# Patient Record
Sex: Male | Born: 1955 | Race: Black or African American | Hispanic: No | Smoking: Current some day smoker
Health system: Southern US, Community
[De-identification: ages and names within clinical notes are randomized; demographics above are authoritative.]

## PROBLEM LIST (undated history)

## (undated) DIAGNOSIS — IMO0001 Reserved for inherently not codable concepts without codable children: Secondary | ICD-10-CM

## (undated) DIAGNOSIS — B1920 Unspecified viral hepatitis C without hepatic coma: Secondary | ICD-10-CM

## (undated) DIAGNOSIS — M1990 Unspecified osteoarthritis, unspecified site: Secondary | ICD-10-CM

## (undated) DIAGNOSIS — S2239XA Fracture of one rib, unspecified side, initial encounter for closed fracture: Secondary | ICD-10-CM

## (undated) DIAGNOSIS — S2249XA Multiple fractures of ribs, unspecified side, initial encounter for closed fracture: Secondary | ICD-10-CM

## (undated) DIAGNOSIS — S42009A Fracture of unspecified part of unspecified clavicle, initial encounter for closed fracture: Secondary | ICD-10-CM

## (undated) DIAGNOSIS — E7800 Pure hypercholesterolemia, unspecified: Secondary | ICD-10-CM

## (undated) DIAGNOSIS — G4730 Sleep apnea, unspecified: Secondary | ICD-10-CM

## (undated) DIAGNOSIS — J449 Chronic obstructive pulmonary disease, unspecified: Secondary | ICD-10-CM

## (undated) DIAGNOSIS — F419 Anxiety disorder, unspecified: Secondary | ICD-10-CM

## (undated) DIAGNOSIS — I10 Essential (primary) hypertension: Secondary | ICD-10-CM

## (undated) HISTORY — PX: COLONOSCOPY W/ POLYPECTOMY: SHX1380

## (undated) HISTORY — PX: INGUINAL HERNIA REPAIR: SUR1180

## (undated) HISTORY — PX: TONSILLECTOMY: SUR1361

## (undated) HISTORY — PX: JOINT REPLACEMENT: SHX530

## (undated) HISTORY — DX: Unspecified viral hepatitis C without hepatic coma: B19.20

---

## 2011-11-08 ENCOUNTER — Other Ambulatory Visit: Payer: Self-pay

## 2011-11-08 ENCOUNTER — Observation Stay (HOSPITAL_COMMUNITY)
Admission: EM | Admit: 2011-11-08 | Discharge: 2011-11-08 | Disposition: A | Discharge status: Home / Self Care | Payer: Self-pay | Attending: Emergency Medicine | Admitting: Emergency Medicine

## 2011-11-08 ENCOUNTER — Emergency Department (HOSPITAL_COMMUNITY): Payer: Self-pay

## 2011-11-08 ENCOUNTER — Encounter (HOSPITAL_COMMUNITY): Payer: Self-pay

## 2011-11-08 DIAGNOSIS — R0602 Shortness of breath: Secondary | ICD-10-CM | POA: Insufficient documentation

## 2011-11-08 DIAGNOSIS — J45901 Unspecified asthma with (acute) exacerbation: Principal | ICD-10-CM | POA: Insufficient documentation

## 2011-11-08 HISTORY — DX: Multiple fractures of ribs, unspecified side, initial encounter for closed fracture: S22.49XA

## 2011-11-08 HISTORY — DX: Chronic obstructive pulmonary disease, unspecified: J44.9

## 2011-11-08 HISTORY — DX: Essential (primary) hypertension: I10

## 2011-11-08 HISTORY — DX: Fracture of unspecified part of unspecified clavicle, initial encounter for closed fracture: S42.009A

## 2011-11-08 HISTORY — DX: Fracture of one rib, unspecified side, initial encounter for closed fracture: S22.39XA

## 2011-11-08 LAB — POCT I-STAT, CHEM 8
Chloride: 101 mEq/L (ref 96–112)
Glucose, Bld: 96 mg/dL (ref 70–99)
HCT: 55 % — ABNORMAL HIGH (ref 39.0–52.0)
Potassium: 3.9 mEq/L (ref 3.5–5.1)

## 2011-11-08 MED ORDER — METHYLPREDNISOLONE SODIUM SUCC 125 MG IJ SOLR
125.0000 mg | Freq: Once | INTRAMUSCULAR | Status: AC
  Administered 2011-11-08: 125 mg via INTRAVENOUS

## 2011-11-08 MED ORDER — ALBUTEROL SULFATE (5 MG/ML) 0.5% IN NEBU
INHALATION_SOLUTION | RESPIRATORY_TRACT | Status: AC
  Filled 2011-11-08: qty 1

## 2011-11-08 MED ORDER — ALBUTEROL (5 MG/ML) CONTINUOUS INHALATION SOLN
10.0000 mg/h | INHALATION_SOLUTION | RESPIRATORY_TRACT | Status: DC
  Administered 2011-11-08: 10 mg/h via RESPIRATORY_TRACT

## 2011-11-08 MED ORDER — MAGNESIUM SULFATE 40 MG/ML IJ SOLN
2.0000 g | INTRAMUSCULAR | Status: AC
  Administered 2011-11-08: 2 g via INTRAVENOUS
  Filled 2011-11-08: qty 50

## 2011-11-08 MED ORDER — METHYLPREDNISOLONE SODIUM SUCC 125 MG IJ SOLR
INTRAMUSCULAR | Status: AC
  Filled 2011-11-08: qty 2

## 2011-11-08 MED ORDER — ALBUTEROL SULFATE (5 MG/ML) 0.5% IN NEBU
5.0000 mg | INHALATION_SOLUTION | RESPIRATORY_TRACT | Status: DC
  Administered 2011-11-08: 5 mg via RESPIRATORY_TRACT
  Filled 2011-11-08: qty 1
  Filled 2011-11-08: qty 0.5

## 2011-11-08 MED ORDER — IPRATROPIUM BROMIDE 0.02 % IN SOLN
RESPIRATORY_TRACT | Status: AC
  Administered 2011-11-08: 0.5 mg
  Filled 2011-11-08: qty 2.5

## 2011-11-08 MED ORDER — PREDNISONE 20 MG PO TABS: ORAL_TABLET | ORAL | Status: AC

## 2011-11-08 NOTE — ED Notes (Signed)
Pt place on monitor, cont. Pulse ox. RT at bedside.

## 2011-11-08 NOTE — ED Provider Notes (Signed)
8:32 PM Patient does have third nebulizer treatments. Significant improvement in lung auscultation. Also reports feeling a significant improvement. O2 sats 94% with ambulation drops to 92%. Patient is from [place] and is not expected to return until February 18. Reports he has an appointment with his physician then. Advised should he have another acute asthma exacerbation he should immediately return to the emergency department. Patient voices understanding and is ready for discharge.  Thomasene Lot, PA-C 11/08/11 2033

## 2011-11-08 NOTE — ED Notes (Signed)
Ordered Regular Diet tray

## 2011-11-08 NOTE — ED Notes (Signed)
Dr.T[place] given copy of ecg. 

## 2011-11-08 NOTE — ED Notes (Signed)
rx x 1, pt voiced understanding to f/u with ED if sx worsen

## 2011-11-08 NOTE — ED Notes (Signed)
Pt c/o sob x4 days, hx of COPD and Asthma

## 2011-11-08 NOTE — ED Provider Notes (Signed)
History     CSN: [id number]  Arrival date & time 11/08/11  1239   First MD Initiated Contact with Patient 11/08/11 1249      No chief complaint on file.  chief complaint shortness of breath  Pleasant 56 year old gentleman with a known history of asthma. States he asked that works at [employer] in [place]. Began feeling short of breath with his typical asthma symptoms 4 days ago. He has been using his inhaler and nebulizer at home with minimal relief. Today, the breathing got worse. He has had no chest pain, no nausea, vomiting, no productive cough or fever. He states he has been admitted before for asthma, but never intubated in the past. Patient is speaking in complete sentences at this time.  (Consider location/radiation/quality/duration/timing/severity/associated sxs/prior treatment) HPI  No past medical history on file.  No past surgical history on file.  No family history on file.  History  Substance Use Topics  . Smoking status: Not on file  . Smokeless tobacco: Not on file  . Alcohol Use: Not on file      Review of Systems  All other systems reviewed and are negative.    Allergies  Penicillins  Home Medications  No current outpatient prescriptions on file.  BP 148/98  Pulse 120  Temp(Src) 98 F (36.7 C) (Oral)  Resp 26  SpO2 97%  Physical Exam  Nursing note and vitals reviewed. Constitutional: He is oriented to person, place, and time. He appears well-developed and well-nourished.  HENT:  Head: Normocephalic and atraumatic.  Eyes: Conjunctivae and EOM are normal. Pupils are equal, round, and reactive to light.  Neck: Neck supple.  Cardiovascular: Normal rate and regular rhythm.  Exam reveals no gallop and no friction rub.   No murmur heard. Pulmonary/Chest: He has wheezes. He has no rales. He exhibits no tenderness.       Diffuse wheezing bilaterally with moderate to poor air exchange. Tachypneic, with no retractions. Able to speak in complete  sentences.  Abdominal: Soft. Bowel sounds are normal. He exhibits no distension. There is no tenderness. There is no rebound and no guarding.  Musculoskeletal: Normal range of motion.  Neurological: He is alert and oriented to person, place, and time. No cranial nerve deficit. Coordination normal.  Skin: Skin is warm and dry. No rash noted.  Psychiatric: He has a normal mood and affect.    ED Course  Procedures (including critical care time)  Labs Reviewed - No data to display No results found.   No diagnosis found.    MDM  Patient is seen and examined, initial history and physical is completed. Evaluation initiated   We'll start one hour continuous nebulized breathing treatments, IV, Solu-Medrol, continuous cardiac monitoring, and pulse oximetry. We'll reassess frequently. Other vital signs are normal    Date: 11/08/2011  Rate: 84  Rhythm: normal sinus rhythm  QRS Axis: left  Intervals: normal  ST/T Wave abnormalities: normal  Conduction Disutrbances:none  Narrative Interpretation:   Old EKG Reviewed: none available   1:57 PM Patient is reassessed in the room. EKG shows no sign of any acute ischemic changes. Based on the persistence of, wheezing. We'll check a chest x-ray, and some basic laboratory studies. I did speak with the patient and would like to get him on the asthma protocol after his x-ray and reassessment. We'll continue to follow closely     Theron Arista A. Patrica Duel, MD 11/08/11 1191

## 2011-11-08 NOTE — ED Notes (Signed)
Family at bedside. 

## 2011-11-08 NOTE — ED Notes (Signed)
PA aware of pt O2 sats during ambulation

## 2011-11-08 NOTE — ED Notes (Signed)
Spoke w/Tim, RT, and advised of pt on asthma protocol and order for HHN and Peak Flow.  Advised he will come to administer.

## 2011-11-08 NOTE — ED Notes (Signed)
Pt c/o SOB x4 days becoming progressively worse last night, pt uses at home Albuterol Nebulizer treatment, Albuterol inhaler and Pro-Air inhaler w/no relief, pt denies any cough or congestion. Pt traveled from [place] to [facility] yesterday. Pt reports hx of Asthma and COPD.

## 2011-11-08 NOTE — ED Notes (Signed)
Greg RT at bedside

## 2011-11-08 NOTE — ED Notes (Signed)
Pt walked to bathroom without assistance, became SOB on exertion, but speaking complete sentences. Placed back on cardiac monitor. Vital signs stable at the time. Denies pain. Respirations unlabored. Family remains at bedside. Will continue to monitor.

## 2012-06-15 IMAGING — CR DG CHEST 2V
2 series · 2 of 2 positions shown · non-contrast
Comparison: None

CLINICAL DATA: Dyspnea.  COPD and asthma.

CHEST - 2 VIEW

[w chest pa]
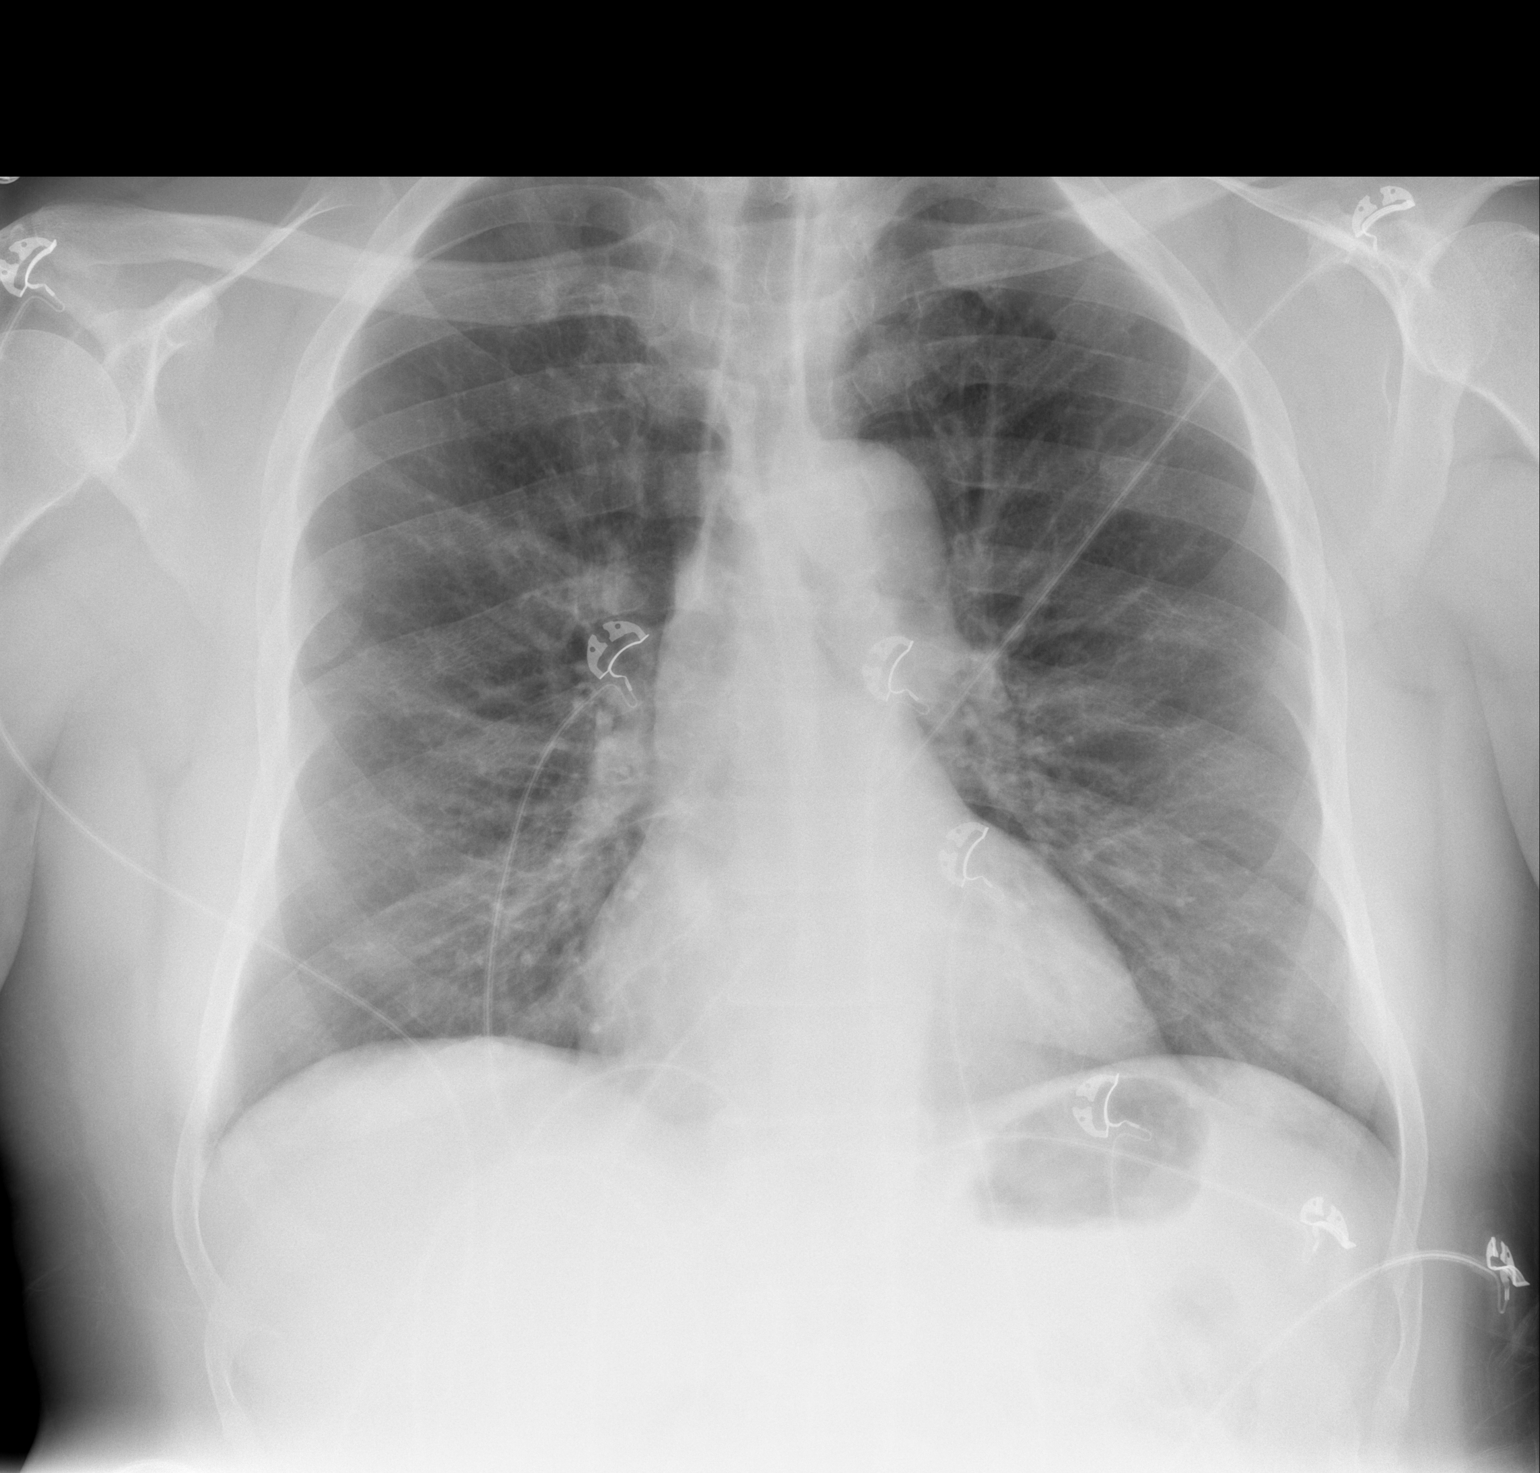

[w chest lat]
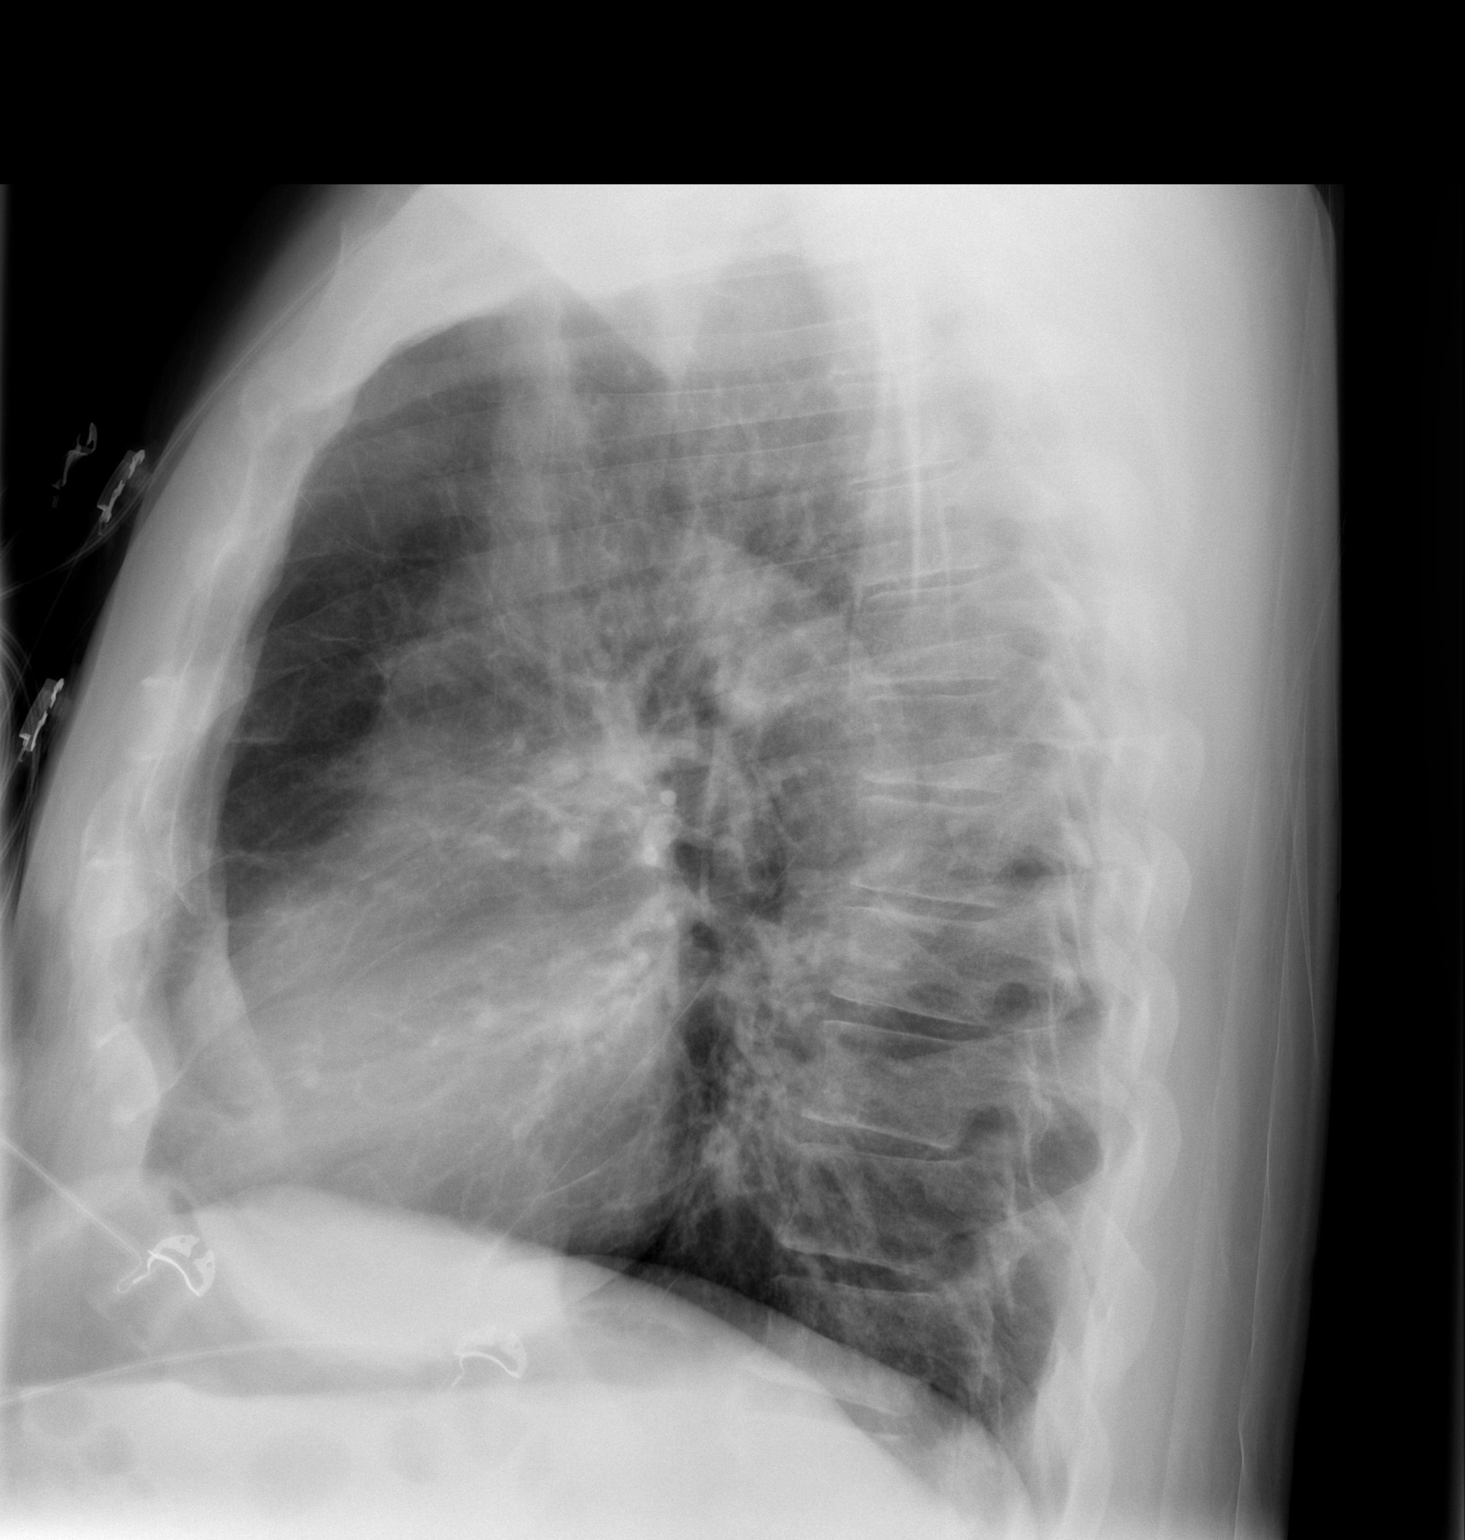

[2 of 2 positions shown; findings below may reference images not displayed]

FINDINGS: The heart size and mediastinal contours are within normal limits.

Bronchitic changes are noted bilaterally.

The visualized skeletal structures are unremarkable.
IMPRESSION: 1.  Bronchitic changes.
2.  No pneumonia

## 2012-10-12 DIAGNOSIS — G4730 Sleep apnea, unspecified: Secondary | ICD-10-CM

## 2012-10-12 HISTORY — DX: Sleep apnea, unspecified: G47.30

## 2013-02-20 LAB — PULMONARY FUNCTION TEST

## 2014-02-21 DIAGNOSIS — M87059 Idiopathic aseptic necrosis of unspecified femur: Secondary | ICD-10-CM | POA: Diagnosis not present

## 2014-04-18 DIAGNOSIS — J449 Chronic obstructive pulmonary disease, unspecified: Secondary | ICD-10-CM | POA: Diagnosis not present

## 2014-04-18 DIAGNOSIS — B1710 Acute hepatitis C without hepatic coma: Secondary | ICD-10-CM | POA: Diagnosis not present

## 2014-04-18 DIAGNOSIS — M130 Polyarthritis, unspecified: Secondary | ICD-10-CM | POA: Diagnosis not present

## 2014-04-18 DIAGNOSIS — I10 Essential (primary) hypertension: Secondary | ICD-10-CM | POA: Diagnosis not present

## 2014-04-27 DIAGNOSIS — B182 Chronic viral hepatitis C: Secondary | ICD-10-CM | POA: Diagnosis not present

## 2014-07-30 ENCOUNTER — Ambulatory Visit: Payer: Self-pay | Admitting: Family Medicine

## 2014-08-02 DIAGNOSIS — Z23 Encounter for immunization: Secondary | ICD-10-CM | POA: Diagnosis not present

## 2014-08-13 ENCOUNTER — Encounter: Payer: Self-pay | Admitting: Family Medicine

## 2014-08-13 ENCOUNTER — Ambulatory Visit (INDEPENDENT_AMBULATORY_CARE_PROVIDER_SITE_OTHER): Payer: Medicare Other | Admitting: Family Medicine

## 2014-08-13 VITALS — BP 148/74 | HR 106 | Temp 98.2°F | Wt 212.9 lb

## 2014-08-13 DIAGNOSIS — B1920 Unspecified viral hepatitis C without hepatic coma: Secondary | ICD-10-CM

## 2014-08-13 DIAGNOSIS — I10 Essential (primary) hypertension: Secondary | ICD-10-CM | POA: Diagnosis not present

## 2014-08-13 DIAGNOSIS — J42 Unspecified chronic bronchitis: Secondary | ICD-10-CM | POA: Diagnosis not present

## 2014-08-13 DIAGNOSIS — J441 Chronic obstructive pulmonary disease with (acute) exacerbation: Secondary | ICD-10-CM | POA: Insufficient documentation

## 2014-08-13 DIAGNOSIS — J449 Chronic obstructive pulmonary disease, unspecified: Secondary | ICD-10-CM | POA: Insufficient documentation

## 2014-08-13 LAB — CMP AND LIVER
ALK PHOS: 84 U/L (ref 39–117)
ALT: 16 U/L (ref 0–53)
AST: 17 U/L (ref 0–37)
Albumin: 3.8 g/dL (ref 3.5–5.2)
BILIRUBIN INDIRECT: 0.5 mg/dL (ref 0.2–1.2)
BILIRUBIN TOTAL: 0.6 mg/dL (ref 0.2–1.2)
BUN: 9 mg/dL (ref 6–23)
Bilirubin, Direct: 0.1 mg/dL (ref 0.0–0.3)
CO2: 27 mEq/L (ref 19–32)
Calcium: 8.9 mg/dL (ref 8.4–10.5)
Chloride: 100 mEq/L (ref 96–112)
Creat: 0.88 mg/dL (ref 0.50–1.35)
GLUCOSE: 98 mg/dL (ref 70–99)
Potassium: 4 mEq/L (ref 3.5–5.3)
SODIUM: 136 meq/L (ref 135–145)
TOTAL PROTEIN: 6.9 g/dL (ref 6.0–8.3)

## 2014-08-13 LAB — CBC WITH DIFFERENTIAL/PLATELET
BASOS ABS: 0 10*3/uL (ref 0.0–0.1)
Basophils Relative: 0 % (ref 0–1)
EOS ABS: 0.1 10*3/uL (ref 0.0–0.7)
EOS PCT: 2 % (ref 0–5)
HCT: 44 % (ref 39.0–52.0)
Hemoglobin: 15 g/dL (ref 13.0–17.0)
Lymphocytes Relative: 15 % (ref 12–46)
Lymphs Abs: 0.9 10*3/uL (ref 0.7–4.0)
MCH: 30.3 pg (ref 26.0–34.0)
MCHC: 34.1 g/dL (ref 30.0–36.0)
MCV: 88.9 fL (ref 78.0–100.0)
Monocytes Absolute: 0.6 10*3/uL (ref 0.1–1.0)
Monocytes Relative: 10 % (ref 3–12)
Neutro Abs: 4.3 10*3/uL (ref 1.7–7.7)
Neutrophils Relative %: 73 % (ref 43–77)
PLATELETS: 209 10*3/uL (ref 150–400)
RBC: 4.95 MIL/uL (ref 4.22–5.81)
RDW: 13.9 % (ref 11.5–15.5)
WBC: 5.9 10*3/uL (ref 4.0–10.5)

## 2014-08-13 LAB — LIPID PANEL
CHOL/HDL RATIO: 2.6 ratio
CHOLESTEROL: 157 mg/dL (ref 0–200)
HDL: 61 mg/dL (ref >39)
LDL Cholesterol: 83 mg/dL (ref 0–99)
Triglycerides: 65 mg/dL (ref <150)
VLDL: 13 mg/dL (ref 0–40)

## 2014-08-13 MED ORDER — IPRATROPIUM-ALBUTEROL 20-100 MCG/ACT IN AERS: 1.0000 | INHALATION_SPRAY | Freq: Four times a day (QID) | RESPIRATORY_TRACT | Status: DC | PRN

## 2014-08-13 MED ORDER — BUDESONIDE-FORMOTEROL FUMARATE 160-4.5 MCG/ACT IN AERO: 2.0000 | INHALATION_SPRAY | Freq: Two times a day (BID) | RESPIRATORY_TRACT | Status: DC

## 2014-08-13 MED ORDER — PREDNISONE 20 MG PO TABS: 40.0000 mg | ORAL_TABLET | Freq: Every day | ORAL | Status: DC

## 2014-08-13 MED ORDER — DICLOFENAC SODIUM 75 MG PO TBEC: 75.0000 mg | DELAYED_RELEASE_TABLET | Freq: Two times a day (BID) | ORAL | Status: DC

## 2014-08-13 MED ORDER — ALBUTEROL SULFATE HFA 108 (90 BASE) MCG/ACT IN AERS: 2.0000 | INHALATION_SPRAY | RESPIRATORY_TRACT | Status: DC | PRN

## 2014-08-13 MED ORDER — AZITHROMYCIN 250 MG PO TABS: ORAL_TABLET | ORAL | Status: DC

## 2014-08-13 NOTE — Assessment & Plan Note (Signed)
Blood pressure mildly elevated today. Suspect due to medication non-compliance -patient encouraged to take BP medication daily -check BMP to evaluate renal function

## 2014-08-13 NOTE — Assessment & Plan Note (Signed)
Patient seen for establishment of care. Having acute COPD exacerbation. -started on 5 day Prednisone burst -Start Z-pack -refilled Symbicort/Combivent/Albuterol

## 2014-08-13 NOTE — Progress Notes (Addendum)
   Subjective:    Patient ID: Scott Cordova, male    DOB: [birth year], 58 y.o.   MRN: [id number]  HPI 58 y/o male presents to establish Care. Moved to [place], [place] in July 2015. Previously lived in [place], [place].   Reviewed PMH/PSH/Social History/Medications/FH and have updated in EPIC.  COPD - patient reports exacerbation of COPD, takes Symbicort BID, uses Combivent Respimat multiple times per day, he reports increased sob and cough with exertion, no associated fevers or chills  Hypertension - takes Amlodipine sporadically, no chest pain/headaches/vision changes  Patient reports history of OA and Hepatitis C.    Review of Systems  Constitutional: Negative for fever, chills and fatigue.  Respiratory: Positive for cough, shortness of breath and wheezing.   Cardiovascular: Negative for chest pain and leg swelling.  Gastrointestinal: Negative for nausea, vomiting and diarrhea.  Musculoskeletal: Positive for arthralgias.       Objective:   Physical Exam Vitals: reviewd Gen: pleasant AAM, NAD HEENT: normocephalic, PERRL, EOMI, no scleral icterus, bilateral TM's pearly grey, nasal septum midline, no rhinorrhea, MMM, poor dentition with multiple caries, uvula midline, neck supple, no cervical adenopathy, no thyromegaly Cardiac: RRR, S1 and S2 present, no murmurs, no heaves/thrills Resp: poor air entry in all lung fields, few scattered wheezes, normal work of effort, talking in full sentences Abd: soft, normal bowel sounds, no organomegaly, no tenderness Ext: no edema, 2+ radial pulses and DP pulses bilatearlly     Assessment & Plan:  Please see problem specific assessment and plan.   RN staff sent request for medical records to former PCP/pulmonologist/ID physician/and Orthopedic physician

## 2014-08-13 NOTE — Patient Instructions (Signed)
It was nice to meet you today.   My nurse will send for records.  Dr. Randolm IdolFletke will call you with you lab results.  [facility].  Start Prednisone 40 mg daily for 5 days. Follow up with Dr. Randolm IdolFletke in 4 weeks.

## 2014-08-14 ENCOUNTER — Encounter: Payer: Self-pay | Admitting: Family Medicine

## 2014-08-14 ENCOUNTER — Telehealth: Payer: Self-pay | Admitting: Family Medicine

## 2014-08-14 DIAGNOSIS — Z0000 Encounter for general adult medical examination without abnormal findings: Secondary | ICD-10-CM

## 2014-08-14 DIAGNOSIS — K219 Gastro-esophageal reflux disease without esophagitis: Secondary | ICD-10-CM | POA: Insufficient documentation

## 2014-08-14 MED ORDER — ASPIRIN 81 MG PO TABS: 81.0000 mg | ORAL_TABLET | Freq: Every day | ORAL | Status: DC

## 2014-08-14 NOTE — Telephone Encounter (Signed)
Discussed lab results. ASCVD 15.3%. Framingham score 9%. Start ASA 81 mg. Hold on statin at this time.

## 2014-08-16 ENCOUNTER — Encounter: Payer: Self-pay | Admitting: Family Medicine

## 2014-08-16 DIAGNOSIS — M160 Bilateral primary osteoarthritis of hip: Secondary | ICD-10-CM | POA: Insufficient documentation

## 2014-08-16 DIAGNOSIS — B1920 Unspecified viral hepatitis C without hepatic coma: Secondary | ICD-10-CM | POA: Insufficient documentation

## 2014-08-29 ENCOUNTER — Encounter: Payer: Self-pay | Admitting: Family Medicine

## 2014-08-29 DIAGNOSIS — Z86010 Personal history of colonic polyps: Secondary | ICD-10-CM | POA: Insufficient documentation

## 2014-08-29 DIAGNOSIS — K5730 Diverticulosis of large intestine without perforation or abscess without bleeding: Secondary | ICD-10-CM | POA: Insufficient documentation

## 2014-08-29 DIAGNOSIS — R772 Abnormality of alphafetoprotein: Secondary | ICD-10-CM | POA: Insufficient documentation

## 2014-09-18 ENCOUNTER — Encounter: Payer: Self-pay | Admitting: Family Medicine

## 2014-09-18 ENCOUNTER — Ambulatory Visit (INDEPENDENT_AMBULATORY_CARE_PROVIDER_SITE_OTHER): Payer: Medicare Other | Admitting: Family Medicine

## 2014-09-18 VITALS — BP 148/86 | HR 85 | Temp 97.6°F | Ht 65.0 in | Wt 213.0 lb

## 2014-09-18 DIAGNOSIS — J449 Chronic obstructive pulmonary disease, unspecified: Secondary | ICD-10-CM | POA: Diagnosis not present

## 2014-09-18 DIAGNOSIS — M1612 Unilateral primary osteoarthritis, left hip: Secondary | ICD-10-CM | POA: Diagnosis not present

## 2014-09-18 DIAGNOSIS — Z23 Encounter for immunization: Secondary | ICD-10-CM | POA: Diagnosis not present

## 2014-09-18 DIAGNOSIS — I10 Essential (primary) hypertension: Secondary | ICD-10-CM

## 2014-09-18 DIAGNOSIS — Z0000 Encounter for general adult medical examination without abnormal findings: Secondary | ICD-10-CM

## 2014-09-18 MED ORDER — TIOTROPIUM BROMIDE MONOHYDRATE 18 MCG IN CAPS: 18.0000 ug | ORAL_CAPSULE | Freq: Every morning | RESPIRATORY_TRACT | Status: DC

## 2014-09-18 NOTE — Patient Instructions (Signed)
COPD - start Spiriva (may be expensive so check with pharmacy), continue daily Symbicort, Combivent only as needed  Blood Pressure - at goal currently  Left hip pain/arthritis - attempt to get records from Orthopedic surgeon.   You will get a pneumonia vaccine today, check with pharmacy on cost of shingles vaccine.

## 2014-09-19 NOTE — Assessment & Plan Note (Signed)
Patient reports history of OA of left hip. Pain controlled with daily Diclofenac -attempt to obtain records (including MRI) from previous Orthopedic physician
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
## 2014-09-19 NOTE — Assessment & Plan Note (Signed)
Slightly elevated according to JNC 8 guidelines however improved from previous -continue Amlodipine and continue to monitor

## 2014-09-19 NOTE — Assessment & Plan Note (Signed)
Provided 13-valent PNA vaccine Declined Shingles Otherwise up to date on Immunizations

## 2014-09-19 NOTE — Assessment & Plan Note (Signed)
Exacerbation from previous visit resolved however symptoms still not controlled as using Combivent 1-2 times per day. -start Spiriva in additions to Symbicort

## 2014-10-30 ENCOUNTER — Other Ambulatory Visit: Payer: Self-pay | Admitting: Family Medicine

## 2014-10-30 NOTE — Telephone Encounter (Signed)
[place].  He need to have his refill for his amlodipine sent to [facility] in [place].  [phone] ph number.

## 2014-10-30 NOTE — Telephone Encounter (Signed)
Pharmacy changed in epic, will forward to PCP. 

## 2014-10-31 MED ORDER — AMLODIPINE BESYLATE 5 MG PO TABS: 5.0000 mg | ORAL_TABLET | Freq: Every day | ORAL | Status: DC

## 2014-10-31 NOTE — Telephone Encounter (Signed)
Patient informed. 

## 2014-10-31 NOTE — Telephone Encounter (Signed)
Prescription sent to pharmacy.  Note to nursing staff - please call patient to inform him

## 2014-11-07 ENCOUNTER — Encounter: Payer: Self-pay | Admitting: *Deleted

## 2014-11-07 NOTE — Progress Notes (Signed)
Prior Authorization received from [employer] for [employer]piriva. Formulary and PA form placed in provider box for completion. Clovis PuMartin, Jackalynn Art L, RN

## 2014-11-08 ENCOUNTER — Other Ambulatory Visit: Payer: Self-pay | Admitting: Family Medicine

## 2014-11-08 ENCOUNTER — Telehealth: Payer: Self-pay | Admitting: Family Medicine

## 2014-11-08 MED ORDER — UMECLIDINIUM BROMIDE 62.5 MCG/INH IN AEPB: 1.0000 | INHALATION_SPRAY | Freq: Every morning | RESPIRATORY_TRACT | Status: DC

## 2014-11-08 NOTE — Telephone Encounter (Signed)
Patient counseled to complete Spiriva.

## 2014-11-08 NOTE — Telephone Encounter (Signed)
Spiriva not covered by insurance. Stop Spiriva and change to Incruse Ellipta.

## 2014-11-08 NOTE — Telephone Encounter (Signed)
Pt called and received Dr. Randolm IdolFletke message. The issue is the pharmacy did give him some of the original medication. Should he wait and take the new one or just take this until he gets the new one. Please call patient. jw

## 2014-11-08 NOTE — Progress Notes (Signed)
Changed Spiriva to Incruse Ellipta. Patient notified.

## 2014-11-12 ENCOUNTER — Encounter: Payer: Self-pay | Admitting: Family Medicine

## 2014-11-12 ENCOUNTER — Ambulatory Visit (INDEPENDENT_AMBULATORY_CARE_PROVIDER_SITE_OTHER): Payer: Medicare Other | Admitting: Family Medicine

## 2014-11-12 VITALS — BP 140/90 | HR 97 | Temp 98.3°F | Ht 65.0 in | Wt 218.2 lb

## 2014-11-12 DIAGNOSIS — M1612 Unilateral primary osteoarthritis, left hip: Secondary | ICD-10-CM | POA: Diagnosis not present

## 2014-11-12 DIAGNOSIS — M7989 Other specified soft tissue disorders: Secondary | ICD-10-CM

## 2014-11-12 DIAGNOSIS — I10 Essential (primary) hypertension: Secondary | ICD-10-CM | POA: Diagnosis not present

## 2014-11-12 DIAGNOSIS — J449 Chronic obstructive pulmonary disease, unspecified: Secondary | ICD-10-CM | POA: Diagnosis not present

## 2014-11-12 DIAGNOSIS — M160 Bilateral primary osteoarthritis of hip: Secondary | ICD-10-CM

## 2014-11-12 MED ORDER — TRAMADOL HCL 50 MG PO TABS: 50.0000 mg | ORAL_TABLET | Freq: Three times a day (TID) | ORAL | Status: DC | PRN

## 2014-11-12 NOTE — Assessment & Plan Note (Signed)
Patient still no completely controlled, small exacerbation due to likely viral illness -continue daily spiriva/symbicort/prn combivent -no steroids or abx today however will consider if symptoms worsent

## 2014-11-12 NOTE — Assessment & Plan Note (Signed)
Patient presents with worsening bilateral hip pain with known OA -continue diclofenac -start Tramadol prn severe pain -referral to Orthopedic surgery for discussion on hip injections vs surgery

## 2014-11-12 NOTE — Assessment & Plan Note (Signed)
Trace edema of bilateral legs, no cardiac signs/symptoms, recent CMET showed normal liver and renal function, suspect venous insufficiency -suspect mild venous insufficiency -monitor clinically and if worsens attempt compression stockings

## 2014-11-12 NOTE — Assessment & Plan Note (Signed)
Mildly elevated today -patient to check at local pharmacy for next 2 weeks -if remains elevated will increase dose of Amlodipine

## 2014-11-12 NOTE — Progress Notes (Signed)
   Subjective:    Patient ID: Scott Cordova, male    DOB: 1956/09/18, 59 y.o.   MRN: [id number]  HPI 59 y/o male presents for routine follow up.   Bilateral hip pain - known OA and avascular necrosis, taking Diclofenac 75 mg BID, has not been controlling pain, right now worse than left, bilateral radiation to groin area  Hypertension - currently taking Amlodipine 5 mg daily, does not check BP at home, no chest pain, no headache, no vision changes  Leg swelling - small amount of leg swelling, noticed indents in lower legs where socks are, no chest pain, no orthopnea, no change in sob  CODP - taking Spiriva daily, taking Symbicort BID, using Combivent Respimat prn, has had some cough and sputum over the past 1-2 weeks, no fevers/chills, no increased sob  Social - former smoker, currently living in [place]   Review of Systems See HPI    Objective:   Physical Exam Vitals: reviewed Gen: pleasant AAM, NAD Cardiac: RRR, S1 and S2 present, no murmurs, no heaves/thrills Resp: poor air entry bilaterally, normal work of effort Ext: trace pedal edema MSK: hip exam - bilateral C sign, pain with FADIR and log roll bilaterally      Assessment & Plan:  Please see problem specific assessment and plan.

## 2014-11-12 NOTE — Patient Instructions (Signed)
Hip Pain - a referral has been made to orthopedic surgery, my nursing staff will call for an appointment, may take Tramadol as needed for pain, continue Diclofenac as needed  High Blood Pressure - check at pharmacy, call office with the results in 2 weeks  Leg swelling - I think this is related to something called venous insufficiency  Return in one month for follow up. Make a list of medical issues. Will address 2-3 at next visit.    Venous Stasis or Chronic Venous Insufficiency Chronic venous insufficiency, also called venous stasis, is a condition that affects the veins in the legs. The condition prevents blood from being pumped through these veins effectively. Blood may no longer be pumped effectively from the legs back to the heart. This condition can range from mild to severe. With proper treatment, you should be able to continue with an active life. CAUSES  Chronic venous insufficiency occurs when the vein walls become stretched, weakened, or damaged or when valves within the vein are damaged. Some common causes of this include:  High blood pressure inside the veins (venous hypertension).  Increased blood pressure in the leg veins from long periods of sitting or standing.  A blood clot that blocks blood flow in a vein (deep vein thrombosis).  Inflammation of a superficial vein (phlebitis) that causes a blood clot to form. RISK FACTORS Various things can make you more likely to develop chronic venous insufficiency, including:  Family history of this condition.  Obesity.  Pregnancy.  Sedentary lifestyle.  Smoking.  Jobs requiring long periods of standing or sitting in one place.  Being a certain age. Women in their 4s and 56s and men in their 76s are more likely to develop this condition. SIGNS AND SYMPTOMS  Symptoms may include:   Varicose veins.  Skin breakdown or ulcers.  Reddened or discolored skin on the leg.  Brown, smooth, tight, and painful skin just above  the ankle, usually on the inside surface (lipodermatosclerosis).  Swelling. DIAGNOSIS  To diagnose this condition, your health care provider will take a medical history and do a physical exam. The following tests may be ordered to confirm the diagnosis:  Duplex ultrasound--A procedure that produces a picture of a blood vessel and nearby organs and also provides information on blood flow through the blood vessel.  Plethysmography--A procedure that tests blood flow.  A venogram, or venography--A procedure used to look at the veins using X-ray and dye. TREATMENT The goals of treatment are to help you return to an active life and to minimize pain or disability. Treatment will depend on the severity of the condition. Medical procedures may be needed for severe cases. Treatment options may include:   Use of compression stockings. These can help with symptoms and lower the chances of the problem getting worse, but they do not cure the problem.  Sclerotherapy--A procedure involving an injection of a material that "dissolves" the damaged veins. Other veins in the network of blood vessels take over the function of the damaged veins.  Surgery to remove the vein or cut off blood flow through the vein (vein stripping or laser ablation surgery).  Surgery to repair a valve. HOME CARE INSTRUCTIONS   Wear compression stockings as directed by your health care provider.  Only take over-the-counter or prescription medicines for pain, discomfort, or fever as directed by your health care provider.  Follow up with your health care provider as directed. SEEK MEDICAL CARE IF:   You have redness, swelling, or  increasing pain in the affected area.  You see a red streak or line that extends up or down from the affected area.  You have a breakdown or loss of skin in the affected area, even if the breakdown is small.  You have an injury to the affected area. SEEK IMMEDIATE MEDICAL CARE IF:   You have an  injury and open wound in the affected area.  Your pain is severe and does not improve with medicine.  You have sudden numbness or weakness in the foot or ankle below the affected area, or you have trouble moving your foot or ankle.  You have a fever or persistent symptoms for more than 2-3 days.  You have a fever and your symptoms suddenly get worse. MAKE SURE YOU:   Understand these instructions.  Will watch your condition.  Will get help right away if you are not doing well or get worse. Document Released: 02/01/2007 Document Revised: 07/19/2013 Document Reviewed: 06/05/2013 [facility] Patient Information 2015 [place], [place]. This information is not intended to replace advice given to you by your health care provider. Make sure you discuss any questions you have with your health care provider. Venous Stasis or Chronic Venous Insufficiency Chronic venous insufficiency, also called venous stasis, is a condition that affects the veins in the legs. The condition prevents blood from being pumped through these veins effectively. Blood may no longer be pumped effectively from the legs back to the heart. This condition can range from mild to severe. With proper treatment, you should be able to continue with an active life. CAUSES  Chronic venous insufficiency occurs when the vein walls become stretched, weakened, or damaged or when valves within the vein are damaged. Some common causes of this include:  High blood pressure inside the veins (venous hypertension).  Increased blood pressure in the leg veins from long periods of sitting or standing.  A blood clot that blocks blood flow in a vein (deep vein thrombosis).  Inflammation of a superficial vein (phlebitis) that causes a blood clot to form. RISK FACTORS Various things can make you more likely to develop chronic venous insufficiency, including:  Family history of this condition.  Obesity.  Pregnancy.  Sedentary  lifestyle.  Smoking.  Jobs requiring long periods of standing or sitting in one place.  Being a certain age. Women in their 49s and 80s and men in their 84s are more likely to develop this condition. SIGNS AND SYMPTOMS  Symptoms may include:   Varicose veins.  Skin breakdown or ulcers.  Reddened or discolored skin on the leg.  Brown, smooth, tight, and painful skin just above the ankle, usually on the inside surface (lipodermatosclerosis).  Swelling. DIAGNOSIS  To diagnose this condition, your health care provider will take a medical history and do a physical exam. The following tests may be ordered to confirm the diagnosis:  Duplex ultrasound--A procedure that produces a picture of a blood vessel and nearby organs and also provides information on blood flow through the blood vessel.  Plethysmography--A procedure that tests blood flow.  A venogram, or venography--A procedure used to look at the veins using X-ray and dye. TREATMENT The goals of treatment are to help you return to an active life and to minimize pain or disability. Treatment will depend on the severity of the condition. Medical procedures may be needed for severe cases. Treatment options may include:   Use of compression stockings. These can help with symptoms and lower the chances of the problem getting  worse, but they do not cure the problem.  Sclerotherapy--A procedure involving an injection of a material that "dissolves" the damaged veins. Other veins in the network of blood vessels take over the function of the damaged veins.  Surgery to remove the vein or cut off blood flow through the vein (vein stripping or laser ablation surgery).  Surgery to repair a valve. HOME CARE INSTRUCTIONS   Wear compression stockings as directed by your health care provider.  Only take over-the-counter or prescription medicines for pain, discomfort, or fever as directed by your health care provider.  Follow up with your  health care provider as directed. SEEK MEDICAL CARE IF:   You have redness, swelling, or increasing pain in the affected area.  You see a red streak or line that extends up or down from the affected area.  You have a breakdown or loss of skin in the affected area, even if the breakdown is small.  You have an injury to the affected area. SEEK IMMEDIATE MEDICAL CARE IF:   You have an injury and open wound in the affected area.  Your pain is severe and does not improve with medicine.  You have sudden numbness or weakness in the foot or ankle below the affected area, or you have trouble moving your foot or ankle.  You have a fever or persistent symptoms for more than 2-3 days.  You have a fever and your symptoms suddenly get worse. MAKE SURE YOU:   Understand these instructions.  Will watch your condition.  Will get help right away if you are not doing well or get worse. Document Released: 02/01/2007 Document Revised: 07/19/2013 Document Reviewed: 06/05/2013 [facility] Patient Information 2015 [place], [place]. This information is not intended to replace advice given to you by your health care provider. Make sure you discuss any questions you have with your health care provider.

## 2014-11-28 DIAGNOSIS — M25551 Pain in right hip: Secondary | ICD-10-CM | POA: Diagnosis not present

## 2014-11-28 DIAGNOSIS — M25552 Pain in left hip: Secondary | ICD-10-CM | POA: Diagnosis not present

## 2014-12-03 ENCOUNTER — Telehealth: Payer: Self-pay | Admitting: Family Medicine

## 2014-12-03 NOTE — Telephone Encounter (Signed)
Spoke with patient, he will call back to schedule appointment.

## 2014-12-03 NOTE — Telephone Encounter (Signed)
Note to nursing staff - please schedule patient for preoperative clearance visit, thanks

## 2014-12-05 ENCOUNTER — Telehealth: Payer: Self-pay | Admitting: Family Medicine

## 2014-12-05 NOTE — Telephone Encounter (Signed)
Returned patient call. Discussed that I have sent in Incruse Ellipta prescription to replace spiriva.

## 2014-12-05 NOTE — Telephone Encounter (Signed)
Pt called and wanted to know if the doctor had decided on another medication to take the place of Spirvia. jw

## 2014-12-11 ENCOUNTER — Ambulatory Visit: Payer: Medicare Other | Admitting: Family Medicine

## 2014-12-19 ENCOUNTER — Telehealth: Payer: Self-pay | Admitting: Family Medicine

## 2014-12-19 DIAGNOSIS — M1612 Unilateral primary osteoarthritis, left hip: Secondary | ICD-10-CM

## 2014-12-19 NOTE — Telephone Encounter (Signed)
Mr. Scott Cordova would like for Dr. Randolm IdolFletke to return his call. He is unable to make appts because his car has broken down and would like to discuss medication / sr

## 2014-12-20 MED ORDER — TRAMADOL HCL 50 MG PO TABS: 50.0000 mg | ORAL_TABLET | Freq: Three times a day (TID) | ORAL | Status: DC | PRN

## 2014-12-20 MED ORDER — DICLOFENAC SODIUM 75 MG PO TBEC: 75.0000 mg | DELAYED_RELEASE_TABLET | Freq: Two times a day (BID) | ORAL | Status: DC

## 2014-12-20 NOTE — Telephone Encounter (Signed)
Rx's called into [facility] ([address])

## 2014-12-20 NOTE — Telephone Encounter (Signed)
Patient unable to make commute to [place](currently lives in [place]) as car is broken down, requests refills of Diclofenac and Tramadol. See below.   Nursing staff - please call in Diclofenac EC 75 mg PO BID, dispense #60, refill #1; also call in Tramadol 50 mg PO Q 8 hours prn pain, dispense #30, refill #0; [facility] [address] in [place]

## 2014-12-25 ENCOUNTER — Ambulatory Visit: Payer: Medicare Other | Admitting: Family Medicine

## 2015-01-03 ENCOUNTER — Other Ambulatory Visit (HOSPITAL_COMMUNITY): Payer: Medicare Other

## 2015-01-15 ENCOUNTER — Inpatient Hospital Stay (HOSPITAL_COMMUNITY): Admission: RE | Admit: 2015-01-15 | Payer: Medicare HMO | Source: Ambulatory Visit | Admitting: Orthopedic Surgery

## 2015-01-15 ENCOUNTER — Encounter (HOSPITAL_COMMUNITY): Admission: RE | Payer: Self-pay | Source: Ambulatory Visit

## 2015-01-15 SURGERY — ARTHROPLASTY, HIP, TOTAL, ANTERIOR APPROACH
Anesthesia: General | Laterality: Right

## 2015-01-30 ENCOUNTER — Other Ambulatory Visit: Payer: Self-pay | Admitting: Family Medicine

## 2015-01-30 DIAGNOSIS — M1612 Unilateral primary osteoarthritis, left hip: Secondary | ICD-10-CM

## 2015-01-30 MED ORDER — ALBUTEROL SULFATE HFA 108 (90 BASE) MCG/ACT IN AERS: 2.0000 | INHALATION_SPRAY | RESPIRATORY_TRACT | Status: DC | PRN

## 2015-01-30 MED ORDER — TRAMADOL HCL 50 MG PO TABS: 50.0000 mg | ORAL_TABLET | Freq: Three times a day (TID) | ORAL | Status: DC | PRN

## 2015-01-30 MED ORDER — BUDESONIDE-FORMOTEROL FUMARATE 160-4.5 MCG/ACT IN AERO: 2.0000 | INHALATION_SPRAY | Freq: Two times a day (BID) | RESPIRATORY_TRACT | Status: DC

## 2015-01-30 NOTE — Telephone Encounter (Signed)
Spoke with patient. Needs refill on Symbicort and Tramadol. Not on Zithromax. Also asked for albuterol refill. [facility] [address] in [place].

## 2015-01-30 NOTE — Telephone Encounter (Signed)
Pt called and needs a refill on his Symbicort, Zithromax, and Tramadol. jw

## 2015-02-04 ENCOUNTER — Other Ambulatory Visit: Payer: Self-pay | Admitting: Family Medicine

## 2015-02-04 DIAGNOSIS — M1612 Unilateral primary osteoarthritis, left hip: Secondary | ICD-10-CM

## 2015-02-04 MED ORDER — BUDESONIDE-FORMOTEROL FUMARATE 160-4.5 MCG/ACT IN AERO
2.0000 | INHALATION_SPRAY | Freq: Two times a day (BID) | RESPIRATORY_TRACT | Status: DC
Start: 2015-02-04 — End: 2015-04-09

## 2015-02-04 MED ORDER — IPRATROPIUM-ALBUTEROL 20-100 MCG/ACT IN AERS: 1.0000 | INHALATION_SPRAY | Freq: Four times a day (QID) | RESPIRATORY_TRACT | Status: DC | PRN

## 2015-02-04 MED ORDER — TRAMADOL HCL 50 MG PO TABS: 50.0000 mg | ORAL_TABLET | Freq: Three times a day (TID) | ORAL | Status: DC | PRN

## 2015-02-04 NOTE — Telephone Encounter (Signed)
[facility] Ipratropium, symbicort and tramadol [facility] [place]

## 2015-02-05 ENCOUNTER — Telehealth: Payer: Self-pay | Admitting: Family Medicine

## 2015-02-05 NOTE — Telephone Encounter (Signed)
Pt called because he needs a new prescription for the Tramadol since the pharmacy will not take it as a refill. Please call today since the patient wants to pick all his medications up at 7 pm tonight. Myriam Jacobsonjw

## 2015-02-05 NOTE — Telephone Encounter (Signed)
Pt informed that in rx for tramadol with no refills. Charika Mikelson Bruna PotterBlount, CMA

## 2015-03-01 ENCOUNTER — Other Ambulatory Visit: Payer: Self-pay | Admitting: *Deleted

## 2015-03-04 MED ORDER — IPRATROPIUM-ALBUTEROL 20-100 MCG/ACT IN AERS: 1.0000 | INHALATION_SPRAY | Freq: Four times a day (QID) | RESPIRATORY_TRACT | Status: DC | PRN

## 2015-03-04 MED ORDER — ALBUTEROL SULFATE HFA 108 (90 BASE) MCG/ACT IN AERS: 2.0000 | INHALATION_SPRAY | RESPIRATORY_TRACT | Status: DC | PRN

## 2015-03-14 ENCOUNTER — Other Ambulatory Visit: Payer: Self-pay | Admitting: Family Medicine

## 2015-03-14 MED ORDER — ALBUTEROL SULFATE HFA 108 (90 BASE) MCG/ACT IN AERS: 2.0000 | INHALATION_SPRAY | RESPIRATORY_TRACT | Status: DC | PRN

## 2015-03-25 ENCOUNTER — Other Ambulatory Visit: Payer: Self-pay | Admitting: Family Medicine

## 2015-03-25 DIAGNOSIS — M1612 Unilateral primary osteoarthritis, left hip: Secondary | ICD-10-CM

## 2015-03-25 NOTE — Telephone Encounter (Signed)
Pt called and needs a refill on his Incruse and Tramadol. Please send to [facility] pharmacy [facility] [place]. jw

## 2015-03-27 ENCOUNTER — Other Ambulatory Visit: Payer: Self-pay | Admitting: Family Medicine

## 2015-03-27 MED ORDER — TRAMADOL HCL 50 MG PO TABS: 50.0000 mg | ORAL_TABLET | Freq: Three times a day (TID) | ORAL | Status: DC | PRN

## 2015-03-27 MED ORDER — UMECLIDINIUM BROMIDE 62.5 MCG/INH IN AEPB: 1.0000 | INHALATION_SPRAY | Freq: Every morning | RESPIRATORY_TRACT | Status: DC

## 2015-03-27 NOTE — Telephone Encounter (Signed)
2nd request.  Elie Gragert L, RN  

## 2015-03-27 NOTE — Telephone Encounter (Signed)
Note to nursing staff - please call in Tramadol 50 mg Q8 hours prn pain, dispense #60, refill #0, please call patient to make him aware of refill, also inform him that he will need to make an appointment before next fill

## 2015-03-28 NOTE — Telephone Encounter (Signed)
See 6/13 note from Dr Randolm Idol - Please make sure this was called and patient notified Thanks LC

## 2015-03-29 NOTE — Telephone Encounter (Signed)
Rx called in and pt informed.  Appt made for 6.28.16. Fleeger, [name]

## 2015-04-09 ENCOUNTER — Ambulatory Visit (INDEPENDENT_AMBULATORY_CARE_PROVIDER_SITE_OTHER): Payer: Medicare HMO | Admitting: Family Medicine

## 2015-04-09 ENCOUNTER — Encounter: Payer: Self-pay | Admitting: Family Medicine

## 2015-04-09 VITALS — BP 140/88 | HR 79 | Temp 97.6°F | Ht 65.0 in | Wt 210.1 lb

## 2015-04-09 DIAGNOSIS — M160 Bilateral primary osteoarthritis of hip: Secondary | ICD-10-CM | POA: Diagnosis not present

## 2015-04-09 DIAGNOSIS — J449 Chronic obstructive pulmonary disease, unspecified: Secondary | ICD-10-CM | POA: Diagnosis not present

## 2015-04-09 DIAGNOSIS — R772 Abnormality of alphafetoprotein: Secondary | ICD-10-CM | POA: Diagnosis not present

## 2015-04-09 DIAGNOSIS — B182 Chronic viral hepatitis C: Secondary | ICD-10-CM | POA: Diagnosis not present

## 2015-04-09 MED ORDER — BUDESONIDE-FORMOTEROL FUMARATE 160-4.5 MCG/ACT IN AERO: 2.0000 | INHALATION_SPRAY | Freq: Two times a day (BID) | RESPIRATORY_TRACT | Status: DC

## 2015-04-09 MED ORDER — AMLODIPINE BESYLATE 5 MG PO TABS: 5.0000 mg | ORAL_TABLET | Freq: Every day | ORAL | Status: DC

## 2015-04-09 MED ORDER — DICLOFENAC SODIUM 75 MG PO TBEC: 75.0000 mg | DELAYED_RELEASE_TABLET | Freq: Two times a day (BID) | ORAL | Status: DC

## 2015-04-09 NOTE — Patient Instructions (Signed)
It was nice to see you today.  COPD - continue the current regimen, please have your daughter smoke outside  Blood Pressure - controlled  Hepatitis C - recheck viral levels  Leg pain - please schedule follow up with [facility].

## 2015-04-10 LAB — AFP TUMOR MARKER: AFP-Tumor Marker: 4.5 ng/mL (ref <6.1)

## 2015-04-10 LAB — HCV RNA QUANT RFLX ULTRA OR GENOTYP: HCV Quantitative: NOT DETECTED IU/mL (ref <15)

## 2015-04-10 NOTE — Assessment & Plan Note (Signed)
Previously treated with negative viral loads. AFP previously elevated.  -recheck viral load and AFP to day to monitor for recurrance

## 2015-04-10 NOTE — Progress Notes (Signed)
   Subjective:    Patient ID: Scott Cordova, male    DOB: September 15, 1956, 59 y.o.   MRN: [id number]  HPI 59 y/o male presents for routine follow up.   COPD - patient currently on Incruse Ellipta once daily, Symbicort 2 puffs BID, and prn Combivent (using 3-4 times per day), breathing worse with activity and the heat/humidity, no fevers/chills/cough/mucous production  Bilateral Hip Pain/OA - has been unable to get back to Orthopedics, has upcoming appointment, would like to attempt steroid injections prior to surgery, taking Diclofenac BID and Tramadol 1-2 times per day which controls pan, uses cain for ambulation  HTN - taking Norvasc daily, no side effects, no headaches/vision changes/chest pain  History of Hepatitis C/elevated AFP - no abdominal pain/nausea/emesis  Social - former smoker, quit early 2016, daughter does still smoke in the house   Review of Systems  Constitutional: Negative for fever, chills and fatigue.  Respiratory: Positive for shortness of breath and wheezing. Negative for cough.   Cardiovascular: Negative for chest pain and leg swelling.  Gastrointestinal: Negative for nausea, vomiting and diarrhea.       Objective:   Physical Exam Vitals: reviewed Gen: pleasant male, NAD HEENT: normocephalic, PERRL, EOMI , no scleral icterus, MMM, uvula midline Cardiac: RRR, S1 and S2 present, no murmur, no heaves/thrills Resp: poor air entry bilaterally, occasional wheeze, no crackles/rales Abd: soft, no tenderness, hepatomegaly, no rebound/guarding Ext: trace edema, 2+ radial pulses     Assessment & Plan:  Please see problem specific assessment and plan.

## 2015-04-10 NOTE — Assessment & Plan Note (Signed)
Uncontrolled. Has continued exposure to smoke at home. Encourage patient to have daughter smoke outside. -continue Incruse and Symbicort -PRN Combiven -if not controlled at next visit will excalate therapy

## 2015-04-10 NOTE — Assessment & Plan Note (Signed)
Recheck AFP as previously elevated.

## 2015-04-10 NOTE — Assessment & Plan Note (Signed)
Pain controlled with Diclofenac and Tramadol. Patient has upcoming appointment with Orthopedics for hip injection.

## 2015-04-12 ENCOUNTER — Telehealth: Payer: Self-pay | Admitting: Family Medicine

## 2015-04-12 DIAGNOSIS — R772 Abnormality of alphafetoprotein: Secondary | ICD-10-CM

## 2015-04-12 DIAGNOSIS — B182 Chronic viral hepatitis C: Secondary | ICD-10-CM

## 2015-04-12 NOTE — Telephone Encounter (Signed)
Discussed negative lab results (Hep C and AFP).

## 2015-04-29 ENCOUNTER — Other Ambulatory Visit: Payer: Self-pay | Admitting: *Deleted

## 2015-04-29 ENCOUNTER — Other Ambulatory Visit: Payer: Self-pay | Admitting: Family Medicine

## 2015-04-29 NOTE — Telephone Encounter (Signed)
Pt called because he needs a refill on his diclofenac called in. He also said that the pharmacy was giving him so issues on his albuterol. He would like us to call them at [phone] and straighten this out. jw

## 2015-04-30 MED ORDER — ALBUTEROL SULFATE HFA 108 (90 BASE) MCG/ACT IN AERS: 2.0000 | INHALATION_SPRAY | RESPIRATORY_TRACT | Status: DC | PRN

## 2015-05-27 ENCOUNTER — Other Ambulatory Visit: Payer: Self-pay | Admitting: Family Medicine

## 2015-05-27 NOTE — Telephone Encounter (Signed)
RN staff - please call in Tramadol 50 mg PO TID prn pain, dispense #60, refill #0, thanks

## 2015-05-27 NOTE — Telephone Encounter (Signed)
[facility] in [place].

## 2015-05-27 NOTE — Telephone Encounter (Signed)
Pt called and needs a refill on his Tramadol called in to [facility] in [place]. jw

## 2015-07-07 ENCOUNTER — Other Ambulatory Visit: Payer: Self-pay | Admitting: Family Medicine

## 2015-07-08 ENCOUNTER — Other Ambulatory Visit: Payer: Self-pay | Admitting: Family Medicine

## 2015-07-08 MED ORDER — TRAMADOL HCL 50 MG PO TABS: 50.0000 mg | ORAL_TABLET | Freq: Three times a day (TID) | ORAL | Status: DC | PRN

## 2015-07-08 NOTE — Telephone Encounter (Signed)
RN staff - please call in Tramadol 50 mg Q8 hours prn pain, dispense #60, refill #0, thanks 

## 2015-07-08 NOTE — Telephone Encounter (Signed)
Rx called into [facility].

## 2015-07-08 NOTE — Telephone Encounter (Signed)
Pt called and needs a refill on Tramadol called in. jw

## 2015-07-29 ENCOUNTER — Telehealth: Payer: Self-pay | Admitting: Family Medicine

## 2015-07-29 ENCOUNTER — Other Ambulatory Visit: Payer: Self-pay | Admitting: Family Medicine

## 2015-07-29 NOTE — Telephone Encounter (Signed)
Pt needs a refill on Ipratropium-Albuterol (COMBIVENT RESPIMAT). Sadie Reynolds, ASA

## 2015-07-30 NOTE — Telephone Encounter (Signed)
Refill sent by other means 

## 2015-08-16 ENCOUNTER — Other Ambulatory Visit: Payer: Self-pay | Admitting: Family Medicine

## 2015-08-16 MED ORDER — DICLOFENAC SODIUM 75 MG PO TBEC: 75.0000 mg | DELAYED_RELEASE_TABLET | Freq: Two times a day (BID) | ORAL | Status: DC

## 2015-08-16 MED ORDER — AMLODIPINE BESYLATE 5 MG PO TABS: 5.0000 mg | ORAL_TABLET | Freq: Every day | ORAL | Status: DC

## 2015-08-16 MED ORDER — TRAMADOL HCL 50 MG PO TABS: 50.0000 mg | ORAL_TABLET | Freq: Three times a day (TID) | ORAL | Status: DC | PRN

## 2015-08-16 NOTE — Telephone Encounter (Signed)
RN staff - please call in Tramadol 50 mg Q 8 hours as needed for pain. Dispense #60. Refill #0. Thanks.

## 2015-08-16 NOTE — Telephone Encounter (Signed)
Pt called and needs a refill on his Diclofenac and Tramadol. jw

## 2015-08-16 NOTE — Telephone Encounter (Signed)
rx called in, pt informed. Scott Cordova, Maryjo RochesterJessica Dawn

## 2015-08-16 NOTE — Telephone Encounter (Signed)
Also, refill request for amLODipine.

## 2015-08-27 ENCOUNTER — Other Ambulatory Visit: Payer: Self-pay | Admitting: Family Medicine

## 2015-08-27 MED ORDER — BUDESONIDE-FORMOTEROL FUMARATE 160-4.5 MCG/ACT IN AERO: 2.0000 | INHALATION_SPRAY | Freq: Two times a day (BID) | RESPIRATORY_TRACT | Status: DC

## 2015-08-27 MED ORDER — ALBUTEROL SULFATE HFA 108 (90 BASE) MCG/ACT IN AERS: 2.0000 | INHALATION_SPRAY | RESPIRATORY_TRACT | Status: DC | PRN

## 2015-08-27 MED ORDER — UMECLIDINIUM BROMIDE 62.5 MCG/INH IN AEPB: 1.0000 | INHALATION_SPRAY | Freq: Every morning | RESPIRATORY_TRACT | Status: DC

## 2015-08-27 NOTE — Telephone Encounter (Signed)
Needs refills on incurse, Symbicort, ventolin inhaler [address] [facility] in [place]  Is out of all 3 Has appt scheduled 09-19-15

## 2015-09-12 ENCOUNTER — Telehealth: Payer: Self-pay | Admitting: *Deleted

## 2015-09-12 NOTE — Telephone Encounter (Signed)
Prior Authorization received from [facility] for Symbicort inhaler. Formulary and PA form placed in provider box for completion. Clovis PuMartin, Daniela Hernan L, RN

## 2015-09-13 ENCOUNTER — Other Ambulatory Visit: Payer: Self-pay | Admitting: Family Medicine

## 2015-09-13 DIAGNOSIS — J449 Chronic obstructive pulmonary disease, unspecified: Secondary | ICD-10-CM

## 2015-09-13 MED ORDER — FLUTICASONE-SALMETEROL 230-21 MCG/ACT IN AERO: 2.0000 | INHALATION_SPRAY | Freq: Two times a day (BID) | RESPIRATORY_TRACT | Status: AC

## 2015-09-13 NOTE — Assessment & Plan Note (Signed)
Symbicort no longer on formulary, will switch to Advair.

## 2015-09-14 NOTE — Telephone Encounter (Signed)
Changed patient to Advair (on formulary).

## 2015-09-19 ENCOUNTER — Ambulatory Visit: Payer: Medicare HMO | Admitting: Family Medicine

## 2015-10-23 ENCOUNTER — Other Ambulatory Visit: Payer: Self-pay | Admitting: Family Medicine

## 2015-10-24 ENCOUNTER — Other Ambulatory Visit: Payer: Self-pay | Admitting: Family Medicine

## 2015-10-24 MED ORDER — TRAMADOL HCL 50 MG PO TABS: 50.0000 mg | ORAL_TABLET | Freq: Three times a day (TID) | ORAL | Status: DC | PRN

## 2015-10-24 NOTE — Telephone Encounter (Signed)
RN - please call in Tramadol 50 mg Q8 hours prn pain, dispense #60, no refills, thanks.  Note that Combivent was sent in yesterday.

## 2015-10-24 NOTE — Telephone Encounter (Signed)
Needs refills: combivent restimet and tramadol ( only 3 pills left[facility] [place]

## 2015-10-25 NOTE — Telephone Encounter (Signed)
Rx for tramadol called into patient pharmacy.

## 2015-11-08 ENCOUNTER — Encounter: Payer: Self-pay | Admitting: Family Medicine

## 2015-11-08 ENCOUNTER — Ambulatory Visit (INDEPENDENT_AMBULATORY_CARE_PROVIDER_SITE_OTHER): Payer: Medicare HMO | Admitting: Family Medicine

## 2015-11-08 VITALS — BP 130/76 | HR 97 | Temp 98.0°F | Ht 65.0 in | Wt 190.0 lb

## 2015-11-08 DIAGNOSIS — M160 Bilateral primary osteoarthritis of hip: Secondary | ICD-10-CM

## 2015-11-08 DIAGNOSIS — Z01818 Encounter for other preprocedural examination: Secondary | ICD-10-CM

## 2015-11-08 DIAGNOSIS — G8929 Other chronic pain: Secondary | ICD-10-CM | POA: Insufficient documentation

## 2015-11-08 DIAGNOSIS — Z7189 Other specified counseling: Secondary | ICD-10-CM

## 2015-11-08 DIAGNOSIS — I10 Essential (primary) hypertension: Secondary | ICD-10-CM | POA: Diagnosis not present

## 2015-11-08 DIAGNOSIS — Z23 Encounter for immunization: Secondary | ICD-10-CM | POA: Diagnosis not present

## 2015-11-08 DIAGNOSIS — J449 Chronic obstructive pulmonary disease, unspecified: Secondary | ICD-10-CM

## 2015-11-08 LAB — CBC
HCT: 45.1 % (ref 39.0–52.0)
Hemoglobin: 15.4 g/dL (ref 13.0–17.0)
MCH: 31.2 pg (ref 26.0–34.0)
MCHC: 34.1 g/dL (ref 30.0–36.0)
MCV: 91.3 fL (ref 78.0–100.0)
MPV: 9.4 fL (ref 8.6–12.4)
Platelets: 313 10*3/uL (ref 150–400)
RBC: 4.94 MIL/uL (ref 4.22–5.81)
RDW: 13.8 % (ref 11.5–15.5)
WBC: 6.1 10*3/uL (ref 4.0–10.5)

## 2015-11-08 LAB — COMPREHENSIVE METABOLIC PANEL
ALT: 14 U/L (ref 9–46)
AST: 16 U/L (ref 10–35)
Albumin: 3.8 g/dL (ref 3.6–5.1)
Alkaline Phosphatase: 79 U/L (ref 40–115)
BUN: 13 mg/dL (ref 7–25)
CO2: 28 mmol/L (ref 20–31)
Calcium: 9.1 mg/dL (ref 8.6–10.3)
Chloride: 103 mmol/L (ref 98–110)
Creat: 0.86 mg/dL (ref 0.70–1.25)
Glucose, Bld: 89 mg/dL (ref 65–99)
Potassium: 4.3 mmol/L (ref 3.5–5.3)
Sodium: 142 mmol/L (ref 135–146)
Total Bilirubin: 0.4 mg/dL (ref 0.2–1.2)
Total Protein: 6.7 g/dL (ref 6.1–8.1)

## 2015-11-08 LAB — LIPID PANEL
CHOL/HDL RATIO: 2.8 ratio (ref <=5.0)
CHOLESTEROL: 129 mg/dL (ref 125–200)
HDL: 46 mg/dL (ref >=40)
LDL Cholesterol: 75 mg/dL (ref <130)
Triglycerides: 38 mg/dL (ref <150)
VLDL: 8 mg/dL (ref <30)

## 2015-11-08 MED ORDER — HYDROCODONE-ACETAMINOPHEN 5-325 MG PO TABS: 1.0000 | ORAL_TABLET | Freq: Four times a day (QID) | ORAL | Status: DC | PRN

## 2015-11-08 NOTE — Assessment & Plan Note (Signed)
Patient presents for pre-operative clearance for Right Hip Replacement. Low cardiac risk per RCRI and NSQIP. Patient has moderate-severe COPD which puts him at higher risk for anesthesia.  -Intermediate risk for surgery -Will complete surgical clearance form and fax to [facility]

## 2015-11-08 NOTE — Assessment & Plan Note (Signed)
Stable -encouraged continued daily use of Incruse and Advair -PRN use of Combivent -suspect component of deconditioning contributing to poor lung function, would benefit from pulmonary rehab program (consider starting after surgery)

## 2015-11-08 NOTE — Progress Notes (Addendum)
   Subjective:    Patient ID: Scott Cordova, male    DOB: Nov 19, 1955, 60 y.o.   MRN: [id number]  HPI  60 y/o male present for surgical clearance. Patient to have Right Total Hip Replacement.   Right Hip OA - taking Tramadol 50 mg every 8 hours (increased to 2 tablets TID recently), also taking Diclofenac 75 mg two tablets BID,  requests something stronger.   COPD - taking Incruse once daily, taking Advair 2 puffs twice daily, using Combivent 3 times per day (worse with anxiety). Able to walk 2 flights of stairs with some dyspnea. No associated chest pain.   HTN - on Amlodipine 5 mg daily, no chest pain, no headaches, chronic vision changes (last eye exam was over a year ago)  Social - smoking when anxious (couple cigarrettes per day)  Tolerated Colonoscopy Anesthesia in 2016.  Hernia Surgery as child.   Review of Systems  Constitutional: Negative for fever, chills and fatigue.  Respiratory: Positive for wheezing. Negative for cough.   Cardiovascular: Negative for chest pain and leg swelling.  Gastrointestinal: Negative for nausea, vomiting and diarrhea.       Objective:   Physical Exam Vitals: reviewed Gen: pleasant male, sitting in wheelchair HEENT: normocephalic, PERRL (left eye slightly more sluggish but still reactive), EOMI, nasal septum midline, no rhinorrhea, MMM, poor dentition (missing all top teeth), no pharyngeal erythema, neck supple, no adenopathy Cardiac: RRR, S1 and S2 present, no murmur Resp: diminished air entry in all fields, no wheezes, normal work of breathing  RCRI 0.4% NSQIP Cardiac Risk 0.1%     Assessment & Plan:  COPD (chronic obstructive pulmonary disease) Stable -encouraged continued daily use of Incruse and Advair -PRN use of Combivent -suspect component of deconditioning contributing to poor lung function, would benefit from pulmonary rehab program (consider starting after surgery)  Essential hypertension, benign Controlled.  -continue  Amlodipine 5 mg daily  Pre-operative clearance Patient presents for pre-operative clearance for Right Hip Replacement. Low cardiac risk per RCRI and NSQIP. Patient has moderate-severe COPD which puts him at higher risk for anesthesia.  -Intermediate risk for surgery -Will complete surgical clearance form and fax to [facility]  Encounter for chronic pain management Initiated Percocet for chronic bilateral hip OA.  -see Overview Section  Osteoarthritis, hip, bilateral Patient continues to have severe pain of bilateral hips (Right > Left). Plans to have right hip replacement in near future. -started Percocet for pain control -Told patient to not take Tramadol while on Percocet -Can continue Diclofenac

## 2015-11-08 NOTE — Patient Instructions (Addendum)
It was nice to see you today.  I have cleared you to have your surgery. I will send the paperwork over to the [facility].  I have prescribed a stronger pain medication called Norco. Take this every 6 hours as needed. Do not take with Tramadol. Continue to take Diclofenac daily.   Stop your aspirin 5 days before your surgery.

## 2015-11-08 NOTE — Assessment & Plan Note (Signed)
Patient continues to have severe pain of bilateral hips (Right > Left). Plans to have right hip replacement in near future. -started Percocet for pain control -Told patient to not take Tramadol while on Percocet -Can continue Diclofenac

## 2015-11-08 NOTE — Assessment & Plan Note (Signed)
Initiated Percocet for chronic bilateral hip OA.  -see Overview Section

## 2015-11-08 NOTE — Assessment & Plan Note (Signed)
Controlled.  -continue Amlodipine 5 mg daily

## 2015-11-09 LAB — DRUG SCR UR, PAIN MGMT, REFLEX CONF
AMPHETAMINE SCRN UR: NEGATIVE
BARBITURATE QUANT UR: NEGATIVE
BENZODIAZEPINES.: NEGATIVE
COCAINE METABOLITES: NEGATIVE
Creatinine,U: 99.32 mg/dL
METHADONE: NEGATIVE
Marijuana Metabolite: NEGATIVE
OPIATES: NEGATIVE
Phencyclidine (PCP): NEGATIVE
Propoxyphene: NEGATIVE

## 2015-11-12 ENCOUNTER — Telehealth: Payer: Self-pay | Admitting: Family Medicine

## 2015-11-12 DIAGNOSIS — E785 Hyperlipidemia, unspecified: Secondary | ICD-10-CM

## 2015-11-12 MED ORDER — ATORVASTATIN CALCIUM 40 MG PO TABS: 40.0000 mg | ORAL_TABLET | Freq: Every day | ORAL | Status: AC

## 2015-11-12 NOTE — Assessment & Plan Note (Signed)
10/2015 ASCVD 13% - started Lipitor 40 mg daily

## 2015-11-12 NOTE — Telephone Encounter (Signed)
Discussed lab results with patient. CMP/CBC wnl. Lipid panel looks good however ASCVD of 13%, started Lipitor 40 mg daily.  Urine drug screen negative for Opiates, patient reports taking Tramadol the day of the urine test, per our lab Tramadol should show a positive. Will plan to repeat urine drug screen at next visit and prior to prescribing any additional narcotics.

## 2015-11-28 NOTE — H&P (Signed)
TOTAL HIP ADMISSION H&P  Patient is admitted for right total hip arthroplasty.  Subjective:  Chief Complaint: right hip pain  HPI: Scott Cordova, 60 y.o. male, has a history of pain and functional disability in the right hip(s) due to arthritis and patient has failed non-surgical conservative treatments for greater than 12 weeks to include NSAID's and/or analgesics, corticosteriod injections, use of assistive devices and activity modification.  Onset of symptoms was abrupt starting 3 years ago with gradually worsening course since that time.The patient noted no past surgery on the right hip(s).  Patient currently rates pain in the right hip at 8 out of 10 with activity. Patient has night pain, worsening of pain with activity and weight bearing, pain that interfers with activities of daily living and crepitus. Patient has evidence of joint space narrowing by imaging studies. This condition presents safety issues increasing the risk of falls.  There is no current active infection.  Patient Active Problem List   Diagnosis Date Noted  . HLD (hyperlipidemia) 11/12/2015  . Pre-operative clearance 11/08/2015  . Encounter for chronic pain management 11/08/2015  . Leg swelling 11/12/2014  . History of colonic polyps 08/29/2014  . Diverticulosis of colon without hemorrhage 08/29/2014  . Elevated AFP 08/29/2014  . Hepatitis C 08/16/2014  . Osteoarthritis, hip, bilateral 08/16/2014  . Preventative health care 08/14/2014  . GERD (gastroesophageal reflux disease) 08/14/2014  . COPD (chronic obstructive pulmonary disease) (HCC) 08/13/2014  . Essential hypertension, benign 08/13/2014   Past Medical History  Diagnosis Date  . Asthma   . COPD (chronic obstructive pulmonary disease) (HCC)   . Collar bone fracture   . Broken ribs   . Hepatitis C 08/13/14    patient reported    Past Surgical History  Procedure Laterality Date  . Hernia repair    . Tonsillectomy      No prescriptions prior to  admission   Allergies  Allergen Reactions  . Penicillins Other (See Comments)    unknown    Social History  Substance Use Topics  . Smoking status: Former Smoker    Types: Cigarettes    Quit date: 10/12/2010  . Smokeless tobacco: Never Used  . Alcohol Use: 3.0 oz/week    3 Cans of beer, 2 Shots of liquor per week    Family History  Problem Relation Age of Onset  . Alcohol abuse Father   . Hyperlipidemia Sister   . Hypertension Sister   . Alcohol abuse Sister   . Drug abuse Sister   . Diabetes Maternal Grandmother      Review of Systems  Constitutional: Negative for fever and chills.  HENT: Negative for congestion and sore throat.   Eyes: Negative for blurred vision.  Respiratory: Negative for cough and wheezing.   Cardiovascular: Negative for chest pain and palpitations.  Gastrointestinal: Negative for nausea, vomiting and abdominal pain.  Musculoskeletal:       Right hip pain with ambulation  Skin: Negative for rash.  Neurological: Negative for dizziness and loss of consciousness.  Psychiatric/Behavioral: Negative for depression and suicidal ideas.  All other systems reviewed and are negative.   Objective:  Physical Exam  Vitals reviewed. Constitutional: He is oriented to person, place, and time. He appears well-developed and well-nourished.  HENT:  Head: Normocephalic and atraumatic.  Eyes: Conjunctivae and EOM are normal. Pupils are equal, round, and reactive to light.  Neck: Normal range of motion. Neck supple.  Cardiovascular: Normal rate and intact distal pulses.   Respiratory: Effort normal and  breath sounds normal.  GI: Soft. Bowel sounds are normal.  Musculoskeletal: Normal range of motion.  Neurological: He is alert and oriented to person, place, and time.  Skin: Skin is warm and dry.  Psychiatric: He has a normal mood and affect. His behavior is normal. Judgment and thought content normal.    Vital signs in last 24 hours:     Labs:   Estimated body mass index is 31.62 kg/(m^2) as calculated from the following:   Height as of 11/08/15:  (1.651 m).   Weight as of 11/08/15: 86.183 kg (190 lb).   Imaging Review Plain radiographs demonstrate severe degenerative joint disease of the right hip(s). The bone quality appears to be fair for age and reported activity level.  Assessment/Plan:  End stage arthritis, right hip(s)  The patient history, physical examination, clinical judgement of the provider and imaging studies are consistent with end stage degenerative joint disease of the right hip(s) and total hip arthroplasty is deemed medically necessary. The treatment options including medical management, injection therapy, arthroscopy and arthroplasty were discussed at length. The risks and benefits of total hip arthroplasty were presented and reviewed. The risks due to aseptic loosening, infection, stiffness, dislocation/subluxation,  thromboembolic complications and other imponderables were discussed.  The patient acknowledged the explanation, agreed to proceed with the plan and consent was signed. Patient is being admitted for inpatient treatment for surgery, pain control, PT, OT, prophylactic antibiotics, VTE prophylaxis, progressive ambulation and ADL's and discharge planning.The patient is planning to be discharged home with home health services

## 2015-12-12 ENCOUNTER — Encounter (HOSPITAL_COMMUNITY): Payer: Self-pay

## 2015-12-12 ENCOUNTER — Encounter (HOSPITAL_COMMUNITY)
Admission: RE | Admit: 2015-12-12 | Discharge: 2015-12-12 | Disposition: A | Discharge status: Home / Self Care | Payer: Medicare HMO | Source: Ambulatory Visit | Attending: Orthopedic Surgery | Admitting: Orthopedic Surgery

## 2015-12-12 ENCOUNTER — Other Ambulatory Visit: Payer: Self-pay

## 2015-12-12 DIAGNOSIS — Z01818 Encounter for other preprocedural examination: Secondary | ICD-10-CM | POA: Diagnosis not present

## 2015-12-12 DIAGNOSIS — Z01812 Encounter for preprocedural laboratory examination: Secondary | ICD-10-CM | POA: Diagnosis not present

## 2015-12-12 DIAGNOSIS — R9431 Abnormal electrocardiogram [ECG] [EKG]: Secondary | ICD-10-CM | POA: Diagnosis not present

## 2015-12-12 DIAGNOSIS — Z0183 Encounter for blood typing: Secondary | ICD-10-CM | POA: Insufficient documentation

## 2015-12-12 DIAGNOSIS — M1611 Unilateral primary osteoarthritis, right hip: Secondary | ICD-10-CM | POA: Diagnosis not present

## 2015-12-12 HISTORY — DX: Sleep apnea, unspecified: G47.30

## 2015-12-12 HISTORY — DX: Reserved for inherently not codable concepts without codable children: IMO0001

## 2015-12-12 HISTORY — DX: Unspecified osteoarthritis, unspecified site: M19.90

## 2015-12-12 LAB — CBC
HCT: 50.3 % (ref 39.0–52.0)
Hemoglobin: 17 g/dL (ref 13.0–17.0)
MCH: 30.9 pg (ref 26.0–34.0)
MCHC: 33.8 g/dL (ref 30.0–36.0)
MCV: 91.3 fL (ref 78.0–100.0)
PLATELETS: 190 10*3/uL (ref 150–400)
RBC: 5.51 MIL/uL (ref 4.22–5.81)
RDW: 13.4 % (ref 11.5–15.5)
WBC: 5.5 10*3/uL (ref 4.0–10.5)

## 2015-12-12 LAB — TYPE AND SCREEN
ABO/RH(D): AB POS
Antibody Screen: NEGATIVE

## 2015-12-12 LAB — URINALYSIS, ROUTINE W REFLEX MICROSCOPIC
Bilirubin Urine: NEGATIVE
Glucose, UA: NEGATIVE mg/dL
HGB URINE DIPSTICK: NEGATIVE
Ketones, ur: NEGATIVE mg/dL
Leukocytes, UA: NEGATIVE
Nitrite: NEGATIVE
Protein, ur: NEGATIVE mg/dL
SPECIFIC GRAVITY, URINE: 1.026 (ref 1.005–1.030)
pH: 6 (ref 5.0–8.0)

## 2015-12-12 LAB — HEPATIC FUNCTION PANEL
ALK PHOS: 80 U/L (ref 38–126)
ALT: 33 U/L (ref 17–63)
AST: 36 U/L (ref 15–41)
Albumin: 4.2 g/dL (ref 3.5–5.0)
BILIRUBIN DIRECT: 0.2 mg/dL (ref 0.1–0.5)
BILIRUBIN INDIRECT: 0.7 mg/dL (ref 0.3–0.9)
TOTAL PROTEIN: 7.6 g/dL (ref 6.5–8.1)
Total Bilirubin: 0.9 mg/dL (ref 0.3–1.2)

## 2015-12-12 LAB — PROTIME-INR
INR: 1.13 (ref 0.00–1.49)
Prothrombin Time: 14.7 seconds (ref 11.6–15.2)

## 2015-12-12 LAB — BASIC METABOLIC PANEL
Anion gap: 9 (ref 5–15)
BUN: 18 mg/dL (ref 6–20)
CO2: 26 mmol/L (ref 22–32)
CREATININE: 0.86 mg/dL (ref 0.61–1.24)
Calcium: 9.4 mg/dL (ref 8.9–10.3)
Chloride: 104 mmol/L (ref 101–111)
GFR calc Af Amer: >60 mL/min (ref >60)
Glucose, Bld: 95 mg/dL (ref 65–99)
Potassium: 3.7 mmol/L (ref 3.5–5.1)
SODIUM: 139 mmol/L (ref 135–145)

## 2015-12-12 LAB — ABO/RH: ABO/RH(D): AB POS

## 2015-12-12 LAB — SURGICAL PCR SCREEN
MRSA, PCR: NEGATIVE
Staphylococcus aureus: NEGATIVE

## 2015-12-12 NOTE — Progress Notes (Signed)
Pt. Formerly lived in [place], he reports that he had a stress test approx. 2013. Told that it was done for reason of establishing baseline. Pt. States sleep study was done as well around the same time. He reports that they hooked up CPAP while testing & he found it near impossible to use, due to something on his face & the noise of the machine. Pt. Never given the device to use at home. Pt. Denies chest concerns today. Followed by Dr. (PCP) at [facility] practice.

## 2015-12-12 NOTE — Pre-Procedure Instructions (Signed)
Scott Cordova  12/12/2015      [facility] [address], [place] [address] [place] [address] Phone: [phone] Fax: [phone]    Your procedure is scheduled on 12/24/2015.  Report to [facility] Admitting at 10:00 A.M.  Call this number if you have problems the morning of surgery:  [phone]   Remember:  Do not eat food or drink liquids after midnight.  On Monday- at midnight    Take these medicines the morning of surgery with A SIP OF WATER:  USE ADVAIR- the morning of surgery, use COMBIVENT if needed              THE NIGHT BEFORE SURGERY- take AMLODIPINE & pain medicine is OK if needed               STOP aspirin & diclofenac- 5 days prior to surgery   Do not wear jewelry   Do not wear lotions, powders, or perfumes.  You may wear deodorant.              Men may shave face and neck.   Do not bring valuables to the hospital.   [facility] is not responsible for any belongings or valuables.  Contacts, dentures or bridgework may not be worn into surgery.  Leave your suitcase in the car.  After surgery it may be brought to your room.  For patients admitted to the hospital, discharge time will be determined by your treatment team.  Patients discharged the day of surgery will not be allowed to drive home.   Name and phone number of your driver:  With family  Special instructions:  Special Instructions: [place] - Preparing for Surgery  Before surgery, you can play an important role.  Because skin is not sterile, your skin needs to be as free of germs as possible.  You can reduce the number of germs on you skin by washing with CHG (chlorahexidine gluconate) soap before surgery.  CHG is an antiseptic cleaner which kills germs and bonds with the skin to continue killing germs even after washing.  Please DO NOT use if you have an allergy to CHG or antibacterial soaps.  If your skin becomes reddened/irritated stop using the  CHG and inform your nurse when you arrive at Short Stay.  Do not shave (including legs and underarms) for at least 48 hours prior to the first CHG shower.  You may shave your face.  Please follow these instructions carefully:   1.  Shower with CHG Soap the night before surgery and the  morning of Surgery.  2.  If you choose to wash your hair, wash your hair first as usual with your  normal shampoo.  3.  After you shampoo, rinse your hair and body thoroughly to remove the  Shampoo.  4.  Use CHG as you would any other liquid soap.  You can apply chg directly to the skin and wash gently with scrungie or a clean washcloth.  5.  Apply the CHG Soap to your body ONLY FROM THE NECK DOWN.    Do not use on open wounds or open sores.  Avoid contact with your eyes, ears, mouth and genitals (private parts).  Wash genitals (private parts)   with your normal soap.  6.  Wash thoroughly, paying special attention to the area where your surgery will be performed.  7.  Thoroughly rinse your body with warm water from the neck  down.  8.  DO NOT shower/wash with your normal soap after using and rinsing off   the CHG Soap.  9.  Pat yourself dry with a clean towel.            10.  Wear clean pajamas.            11.  Place clean sheets on your bed the night of your first shower and do not sleep with pets.  Day of Surgery  Do not apply any lotions/deodorants the morning of surgery.  Please wear clean clothes to the hospital/surgery center. Please read over the following fact sheets that you were given. Pain Booklet, Coughing and Deep Breathing, Blood Transfusion Information, MRSA Information and Surgical Site Infection Prevention

## 2015-12-13 ENCOUNTER — Telehealth: Payer: Self-pay | Admitting: Family Medicine

## 2015-12-13 MED ORDER — INCRUSE ELLIPTA 62.5 MCG/INH IN AEPB: 1.0000 | INHALATION_SPRAY | Freq: Every day | RESPIRATORY_TRACT | Status: DC

## 2015-12-13 NOTE — Telephone Encounter (Signed)
Last prescribed by Dr. Randolm IdolFletke on 07/08/15. Will forward to MD

## 2015-12-13 NOTE — Telephone Encounter (Signed)
Refill sent to pharmacy.   

## 2015-12-13 NOTE — Telephone Encounter (Signed)
Pt called because he needs a refill on his Incruse called in. He only has 2 puffs left. jw

## 2015-12-23 MED ORDER — POTASSIUM CHLORIDE IN NACL 20-0.45 MEQ/L-% IV SOLN
INTRAVENOUS | Status: DC
  Filled 2015-12-23: qty 1000

## 2015-12-23 MED ORDER — ACETAMINOPHEN 500 MG PO TABS
1000.0000 mg | ORAL_TABLET | Freq: Once | ORAL | Status: AC
  Administered 2015-12-24: 1000 mg via ORAL
  Filled 2015-12-23: qty 2

## 2015-12-23 MED ORDER — TRANEXAMIC ACID 1000 MG/10ML IV SOLN
2000.0000 mg | INTRAVENOUS | Status: AC
  Administered 2015-12-24: 2000 mg via TOPICAL
  Filled 2015-12-23: qty 20

## 2015-12-24 ENCOUNTER — Encounter (HOSPITAL_COMMUNITY)
Admission: RE | Disposition: A | Discharge status: Home Health | Payer: Self-pay | Source: Ambulatory Visit | Attending: Orthopedic Surgery

## 2015-12-24 ENCOUNTER — Inpatient Hospital Stay (HOSPITAL_COMMUNITY): Payer: Medicare HMO

## 2015-12-24 ENCOUNTER — Inpatient Hospital Stay (HOSPITAL_COMMUNITY)
Admission: RE | Admit: 2015-12-24 | Discharge: 2015-12-25 | DRG: 470 | Disposition: A | Discharge status: Home Health | Payer: Medicare HMO | Source: Ambulatory Visit | Attending: Orthopedic Surgery | Admitting: Orthopedic Surgery

## 2015-12-24 ENCOUNTER — Inpatient Hospital Stay (HOSPITAL_COMMUNITY): Payer: Medicare HMO | Admitting: Certified Registered Nurse Anesthetist

## 2015-12-24 ENCOUNTER — Encounter (HOSPITAL_COMMUNITY): Payer: Self-pay | Admitting: Certified Registered Nurse Anesthetist

## 2015-12-24 DIAGNOSIS — G4730 Sleep apnea, unspecified: Secondary | ICD-10-CM | POA: Diagnosis present

## 2015-12-24 DIAGNOSIS — M1611 Unilateral primary osteoarthritis, right hip: Secondary | ICD-10-CM | POA: Diagnosis present

## 2015-12-24 DIAGNOSIS — J449 Chronic obstructive pulmonary disease, unspecified: Secondary | ICD-10-CM | POA: Diagnosis present

## 2015-12-24 DIAGNOSIS — I10 Essential (primary) hypertension: Secondary | ICD-10-CM | POA: Diagnosis present

## 2015-12-24 DIAGNOSIS — K5730 Diverticulosis of large intestine without perforation or abscess without bleeding: Secondary | ICD-10-CM | POA: Diagnosis present

## 2015-12-24 DIAGNOSIS — B1920 Unspecified viral hepatitis C without hepatic coma: Secondary | ICD-10-CM | POA: Diagnosis present

## 2015-12-24 DIAGNOSIS — J45909 Unspecified asthma, uncomplicated: Secondary | ICD-10-CM | POA: Diagnosis present

## 2015-12-24 DIAGNOSIS — K219 Gastro-esophageal reflux disease without esophagitis: Secondary | ICD-10-CM | POA: Diagnosis present

## 2015-12-24 DIAGNOSIS — M879 Osteonecrosis, unspecified: Principal | ICD-10-CM | POA: Diagnosis present

## 2015-12-24 DIAGNOSIS — Z419 Encounter for procedure for purposes other than remedying health state, unspecified: Secondary | ICD-10-CM

## 2015-12-24 DIAGNOSIS — E785 Hyperlipidemia, unspecified: Secondary | ICD-10-CM | POA: Diagnosis present

## 2015-12-24 DIAGNOSIS — Z880 Allergy status to penicillin: Secondary | ICD-10-CM

## 2015-12-24 DIAGNOSIS — Z87891 Personal history of nicotine dependence: Secondary | ICD-10-CM | POA: Diagnosis not present

## 2015-12-24 DIAGNOSIS — M25551 Pain in right hip: Secondary | ICD-10-CM | POA: Diagnosis present

## 2015-12-24 DIAGNOSIS — Z86010 Personal history of colonic polyps: Secondary | ICD-10-CM

## 2015-12-24 DIAGNOSIS — Z8249 Family history of ischemic heart disease and other diseases of the circulatory system: Secondary | ICD-10-CM | POA: Diagnosis not present

## 2015-12-24 DIAGNOSIS — M8700 Idiopathic aseptic necrosis of unspecified bone: Secondary | ICD-10-CM | POA: Diagnosis present

## 2015-12-24 HISTORY — PX: TOTAL HIP ARTHROPLASTY: SHX124

## 2015-12-24 SURGERY — ARTHROPLASTY, HIP, TOTAL, ANTERIOR APPROACH
Anesthesia: General | Site: Hip | Laterality: Right

## 2015-12-24 MED ORDER — PHENYLEPHRINE HCL 10 MG/ML IJ SOLN
INTRAMUSCULAR | Status: DC | PRN
  Administered 2015-12-24 (×3): 80 ug via INTRAVENOUS
  Administered 2015-12-24 (×2): 40 ug via INTRAVENOUS
  Administered 2015-12-24: 80 ug via INTRAVENOUS

## 2015-12-24 MED ORDER — MIDAZOLAM HCL 2 MG/2ML IJ SOLN
INTRAMUSCULAR | Status: AC
  Filled 2015-12-24: qty 2

## 2015-12-24 MED ORDER — ONDANSETRON HCL 4 MG/2ML IJ SOLN: 4.0000 mg | Freq: Four times a day (QID) | INTRAMUSCULAR | Status: DC | PRN

## 2015-12-24 MED ORDER — PROPOFOL 500 MG/50ML IV EMUL
INTRAVENOUS | Status: DC | PRN
  Administered 2015-12-24: 100 ug/kg/min via INTRAVENOUS

## 2015-12-24 MED ORDER — BUPIVACAINE HCL (PF) 0.5 % IJ SOLN
INTRAMUSCULAR | Status: DC | PRN
  Administered 2015-12-24: 3 mL via INTRATHECAL

## 2015-12-24 MED ORDER — DEXAMETHASONE SODIUM PHOSPHATE 10 MG/ML IJ SOLN
10.0000 mg | Freq: Once | INTRAMUSCULAR | Status: AC
  Administered 2015-12-25: 10 mg via INTRAVENOUS
  Filled 2015-12-24: qty 1

## 2015-12-24 MED ORDER — BUPIVACAINE-EPINEPHRINE 0.25% -1:200000 IJ SOLN
INTRAMUSCULAR | Status: DC | PRN
  Administered 2015-12-24: 30 mL

## 2015-12-24 MED ORDER — BUPIVACAINE-EPINEPHRINE (PF) 0.25% -1:200000 IJ SOLN
INTRAMUSCULAR | Status: AC
  Filled 2015-12-24: qty 30

## 2015-12-24 MED ORDER — PROMETHAZINE HCL 25 MG/ML IJ SOLN
6.2500 mg | INTRAMUSCULAR | Status: DC | PRN
Start: 2015-12-24 — End: 2015-12-24

## 2015-12-24 MED ORDER — LACTATED RINGERS IV SOLN
INTRAVENOUS | Status: DC
  Administered 2015-12-24: 11:00:00 via INTRAVENOUS

## 2015-12-24 MED ORDER — CLINDAMYCIN PHOSPHATE 600 MG/50ML IV SOLN
600.0000 mg | Freq: Four times a day (QID) | INTRAVENOUS | Status: AC
  Administered 2015-12-24 – 2015-12-25 (×2): 600 mg via INTRAVENOUS
  Filled 2015-12-24 (×2): qty 50

## 2015-12-24 MED ORDER — 0.9 % SODIUM CHLORIDE (POUR BTL) OPTIME
TOPICAL | Status: DC | PRN
  Administered 2015-12-24: 1000 mL

## 2015-12-24 MED ORDER — PROPOFOL 10 MG/ML IV BOLUS
INTRAVENOUS | Status: AC
Start: 2015-12-24 — End: 2015-12-24
  Filled 2015-12-24: qty 20

## 2015-12-24 MED ORDER — CEFAZOLIN SODIUM-DEXTROSE 2-3 GM-% IV SOLR
2.0000 g | INTRAVENOUS | Status: DC
  Filled 2015-12-24: qty 50

## 2015-12-24 MED ORDER — METHOCARBAMOL 500 MG PO TABS
500.0000 mg | ORAL_TABLET | Freq: Four times a day (QID) | ORAL | Status: DC | PRN
  Administered 2015-12-24 – 2015-12-25 (×2): 500 mg via ORAL
  Filled 2015-12-24 (×2): qty 1

## 2015-12-24 MED ORDER — ACETAMINOPHEN 650 MG RE SUPP: 650.0000 mg | Freq: Four times a day (QID) | RECTAL | Status: DC | PRN

## 2015-12-24 MED ORDER — DOCUSATE SODIUM 100 MG PO CAPS: 100.0000 mg | ORAL_CAPSULE | Freq: Two times a day (BID) | ORAL | Status: DC

## 2015-12-24 MED ORDER — MIDAZOLAM HCL 5 MG/5ML IJ SOLN
INTRAMUSCULAR | Status: DC | PRN
  Administered 2015-12-24: 2 mg via INTRAVENOUS

## 2015-12-24 MED ORDER — SODIUM CHLORIDE FLUSH 0.9 % IV SOLN
INTRAVENOUS | Status: DC | PRN
  Administered 2015-12-24: 30 mL

## 2015-12-24 MED ORDER — CHLORHEXIDINE GLUCONATE 4 % EX LIQD: 60.0000 mL | Freq: Once | CUTANEOUS | Status: DC

## 2015-12-24 MED ORDER — AMLODIPINE BESYLATE 5 MG PO TABS
5.0000 mg | ORAL_TABLET | Freq: Every day | ORAL | Status: DC
  Administered 2015-12-24: 5 mg via ORAL
  Filled 2015-12-24: qty 1

## 2015-12-24 MED ORDER — ATORVASTATIN CALCIUM 40 MG PO TABS
40.0000 mg | ORAL_TABLET | Freq: Every day | ORAL | Status: DC
  Administered 2015-12-25: 40 mg via ORAL
  Filled 2015-12-24: qty 1

## 2015-12-24 MED ORDER — MEPERIDINE HCL 25 MG/ML IJ SOLN: 6.2500 mg | INTRAMUSCULAR | Status: DC | PRN

## 2015-12-24 MED ORDER — FLUTICASONE FUROATE-VILANTEROL 200-25 MCG/INH IN AEPB
1.0000 | INHALATION_SPRAY | Freq: Every day | RESPIRATORY_TRACT | Status: DC
  Administered 2015-12-25: 1 via RESPIRATORY_TRACT
  Filled 2015-12-24 (×2): qty 28

## 2015-12-24 MED ORDER — METHOCARBAMOL 500 MG PO TABS: 500.0000 mg | ORAL_TABLET | Freq: Four times a day (QID) | ORAL | Status: DC | PRN

## 2015-12-24 MED ORDER — LACTATED RINGERS IV SOLN
INTRAVENOUS | Status: DC | PRN
  Administered 2015-12-24 (×2): via INTRAVENOUS

## 2015-12-24 MED ORDER — PHENOL 1.4 % MT LIQD: 1.0000 | OROMUCOSAL | Status: DC | PRN

## 2015-12-24 MED ORDER — ACETAMINOPHEN 325 MG PO TABS: 650.0000 mg | ORAL_TABLET | Freq: Four times a day (QID) | ORAL | Status: DC | PRN

## 2015-12-24 MED ORDER — PHENYLEPHRINE HCL 10 MG/ML IJ SOLN
10.0000 mg | INTRAVENOUS | Status: DC | PRN
  Administered 2015-12-24: 20 ug/min via INTRAVENOUS

## 2015-12-24 MED ORDER — METHOCARBAMOL 1000 MG/10ML IJ SOLN: 500.0000 mg | Freq: Four times a day (QID) | INTRAMUSCULAR | Status: DC | PRN

## 2015-12-24 MED ORDER — FENTANYL CITRATE (PF) 100 MCG/2ML IJ SOLN
INTRAMUSCULAR | Status: DC | PRN
  Administered 2015-12-24: 50 ug via INTRAVENOUS

## 2015-12-24 MED ORDER — HYDROMORPHONE HCL 1 MG/ML IJ SOLN: 0.2500 mg | INTRAMUSCULAR | Status: DC | PRN

## 2015-12-24 MED ORDER — HYDROCODONE-ACETAMINOPHEN 5-325 MG PO TABS: 1.0000 | ORAL_TABLET | ORAL | Status: DC | PRN

## 2015-12-24 MED ORDER — DEXTROSE-NACL 5-0.45 % IV SOLN
INTRAVENOUS | Status: DC
  Administered 2015-12-24: 22:00:00 via INTRAVENOUS

## 2015-12-24 MED ORDER — METOCLOPRAMIDE HCL 5 MG PO TABS: 5.0000 mg | ORAL_TABLET | Freq: Three times a day (TID) | ORAL | Status: DC | PRN

## 2015-12-24 MED ORDER — ONDANSETRON HCL 4 MG/2ML IJ SOLN
INTRAMUSCULAR | Status: AC
  Filled 2015-12-24: qty 2

## 2015-12-24 MED ORDER — MORPHINE SULFATE (PF) 2 MG/ML IV SOLN: 2.0000 mg | INTRAVENOUS | Status: DC | PRN

## 2015-12-24 MED ORDER — LIDOCAINE HCL (CARDIAC) 20 MG/ML IV SOLN
INTRAVENOUS | Status: AC
  Filled 2015-12-24: qty 5

## 2015-12-24 MED ORDER — KETOROLAC TROMETHAMINE 30 MG/ML IJ SOLN
INTRAMUSCULAR | Status: DC | PRN
  Administered 2015-12-24: 30 mg

## 2015-12-24 MED ORDER — CEFAZOLIN SODIUM-DEXTROSE 2-3 GM-% IV SOLR
INTRAVENOUS | Status: AC
  Filled 2015-12-24: qty 50

## 2015-12-24 MED ORDER — CLINDAMYCIN PHOSPHATE 900 MG/50ML IV SOLN
900.0000 mg | INTRAVENOUS | Status: AC
  Administered 2015-12-24: 900 mg via INTRAVENOUS
  Filled 2015-12-24: qty 50

## 2015-12-24 MED ORDER — ALBUTEROL SULFATE (2.5 MG/3ML) 0.083% IN NEBU
2.5000 mg | INHALATION_SOLUTION | RESPIRATORY_TRACT | Status: DC | PRN
Start: 2015-12-24 — End: 2015-12-25

## 2015-12-24 MED ORDER — ROCURONIUM BROMIDE 50 MG/5ML IV SOLN
INTRAVENOUS | Status: AC
  Filled 2015-12-24: qty 1

## 2015-12-24 MED ORDER — FENTANYL CITRATE (PF) 250 MCG/5ML IJ SOLN
INTRAMUSCULAR | Status: AC
  Filled 2015-12-24: qty 5

## 2015-12-24 MED ORDER — ONDANSETRON HCL 4 MG PO TABS: 4.0000 mg | ORAL_TABLET | Freq: Four times a day (QID) | ORAL | Status: DC | PRN

## 2015-12-24 MED ORDER — ONDANSETRON HCL 4 MG PO TABS: 4.0000 mg | ORAL_TABLET | Freq: Three times a day (TID) | ORAL | Status: DC | PRN

## 2015-12-24 MED ORDER — ASPIRIN EC 325 MG PO TBEC
325.0000 mg | DELAYED_RELEASE_TABLET | Freq: Every day | ORAL | Status: DC
  Administered 2015-12-25: 325 mg via ORAL
  Filled 2015-12-24: qty 1

## 2015-12-24 MED ORDER — CELECOXIB 200 MG PO CAPS
200.0000 mg | ORAL_CAPSULE | Freq: Two times a day (BID) | ORAL | Status: DC
  Administered 2015-12-24 – 2015-12-25 (×2): 200 mg via ORAL
  Filled 2015-12-24 (×2): qty 1

## 2015-12-24 MED ORDER — ASPIRIN EC 325 MG PO TBEC: 325.0000 mg | DELAYED_RELEASE_TABLET | Freq: Every day | ORAL | Status: DC

## 2015-12-24 MED ORDER — PHENYLEPHRINE 40 MCG/ML (10ML) SYRINGE FOR IV PUSH (FOR BLOOD PRESSURE SUPPORT)
PREFILLED_SYRINGE | INTRAVENOUS | Status: AC
  Filled 2015-12-24: qty 10

## 2015-12-24 MED ORDER — MENTHOL 3 MG MT LOZG: 1.0000 | LOZENGE | OROMUCOSAL | Status: DC | PRN

## 2015-12-24 MED ORDER — ONDANSETRON HCL 4 MG/2ML IJ SOLN
INTRAMUSCULAR | Status: DC | PRN
  Administered 2015-12-24: 4 mg via INTRAVENOUS

## 2015-12-24 MED ORDER — METOCLOPRAMIDE HCL 5 MG/ML IJ SOLN: 5.0000 mg | Freq: Three times a day (TID) | INTRAMUSCULAR | Status: DC | PRN

## 2015-12-24 MED ORDER — HYDROCODONE-ACETAMINOPHEN 5-325 MG PO TABS
1.0000 | ORAL_TABLET | ORAL | Status: DC | PRN
  Administered 2015-12-24 – 2015-12-25 (×2): 1 via ORAL
  Filled 2015-12-24 (×2): qty 1

## 2015-12-24 SURGICAL SUPPLY — 58 items
BAG DECANTER FOR FLEXI CONT (MISCELLANEOUS) IMPLANT
BLADE SAG 18X100X1.27 (BLADE) IMPLANT
BLADE SAW SGTL 18X1.27X75 (BLADE) ×2 IMPLANT
BLADE SAW SGTL 18X1.27X75MM (BLADE) ×1
BLADE SURG ROTATE 9660 (MISCELLANEOUS) IMPLANT
CAPT HIP TOTAL 2 ×3 IMPLANT
CLOSURE STERI-STRIP 1/2X4 (GAUZE/BANDAGES/DRESSINGS) ×1
CLSR STERI-STRIP ANTIMIC 1/2X4 (GAUZE/BANDAGES/DRESSINGS) ×2 IMPLANT
COVER PERINEAL POST (MISCELLANEOUS) ×3 IMPLANT
COVER SURGICAL LIGHT HANDLE (MISCELLANEOUS) ×3 IMPLANT
DERMABOND ADVANCED (GAUZE/BANDAGES/DRESSINGS) ×2
DERMABOND ADVANCED .7 DNX12 (GAUZE/BANDAGES/DRESSINGS) ×1 IMPLANT
DRAPE C-ARM 42X72 X-RAY (DRAPES) IMPLANT
DRAPE STERI IOBAN 125X83 (DRAPES) ×3 IMPLANT
DRAPE U-SHAPE 47X51 STRL (DRAPES) ×6 IMPLANT
DRSG MEPILEX BORDER 4X8 (GAUZE/BANDAGES/DRESSINGS) ×3 IMPLANT
DURAPREP 26ML APPLICATOR (WOUND CARE) ×3 IMPLANT
ELECT BLADE 4.0 EZ CLEAN MEGAD (MISCELLANEOUS) ×3
ELECT REM PT RETURN 9FT ADLT (ELECTROSURGICAL) ×3
ELECTRODE BLDE 4.0 EZ CLN MEGD (MISCELLANEOUS) ×1 IMPLANT
ELECTRODE REM PT RTRN 9FT ADLT (ELECTROSURGICAL) ×1 IMPLANT
FACESHIELD WRAPAROUND (MASK) ×6 IMPLANT
GLOVE BIO SURGEON STRL SZ7 (GLOVE) ×3 IMPLANT
GLOVE BIO SURGEON STRL SZ7.5 (GLOVE) ×3 IMPLANT
GLOVE BIOGEL PI IND STRL 7.0 (GLOVE) ×1 IMPLANT
GLOVE BIOGEL PI IND STRL 8 (GLOVE) ×1 IMPLANT
GLOVE BIOGEL PI INDICATOR 7.0 (GLOVE) ×2
GLOVE BIOGEL PI INDICATOR 8 (GLOVE) ×2
GOWN STRL REUS W/ TWL LRG LVL3 (GOWN DISPOSABLE) ×2 IMPLANT
GOWN STRL REUS W/ TWL XL LVL3 (GOWN DISPOSABLE) ×1 IMPLANT
GOWN STRL REUS W/TWL LRG LVL3 (GOWN DISPOSABLE) ×4
GOWN STRL REUS W/TWL XL LVL3 (GOWN DISPOSABLE) ×2
KIT BASIN OR (CUSTOM PROCEDURE TRAY) ×3 IMPLANT
KIT ROOM TURNOVER OR (KITS) ×3 IMPLANT
MANIFOLD NEPTUNE II (INSTRUMENTS) ×3 IMPLANT
NDL SAFETY ECLIPSE 18X1.5 (NEEDLE) ×1 IMPLANT
NEEDLE 18GX1X1/2 (RX/OR ONLY) (NEEDLE) IMPLANT
NEEDLE HYPO 18GX1.5 SHARP (NEEDLE) ×2
NS IRRIG 1000ML POUR BTL (IV SOLUTION) ×3 IMPLANT
PACK TOTAL JOINT (CUSTOM PROCEDURE TRAY) ×3 IMPLANT
PACK UNIVERSAL I (CUSTOM PROCEDURE TRAY) ×3 IMPLANT
PAD ARMBOARD 7.5X6 YLW CONV (MISCELLANEOUS) ×3 IMPLANT
SPONGE LAP 18X18 X RAY DECT (DISPOSABLE) IMPLANT
SUT MNCRL AB 4-0 PS2 18 (SUTURE) ×3 IMPLANT
SUT MON AB 2-0 CT1 36 (SUTURE) ×3 IMPLANT
SUT VIC AB 0 CT1 27 (SUTURE) ×2
SUT VIC AB 0 CT1 27XBRD ANBCTR (SUTURE) ×1 IMPLANT
SUT VIC AB 1 CT1 27 (SUTURE) ×2
SUT VIC AB 1 CT1 27XBRD ANBCTR (SUTURE) ×1 IMPLANT
SUT VIC AB 2-0 CT1 27 (SUTURE) ×2
SUT VIC AB 2-0 CT1 TAPERPNT 27 (SUTURE) ×1 IMPLANT
SYR 50ML LL SCALE MARK (SYRINGE) ×3 IMPLANT
SYRINGE 20CC LL (MISCELLANEOUS) IMPLANT
TOWEL OR 17X24 6PK STRL BLUE (TOWEL DISPOSABLE) ×3 IMPLANT
TOWEL OR 17X26 10 PK STRL BLUE (TOWEL DISPOSABLE) ×3 IMPLANT
TRAY FOLEY CATH 16FRSI W/METER (SET/KITS/TRAYS/PACK) IMPLANT
WATER STERILE IRR 1000ML POUR (IV SOLUTION) ×3 IMPLANT
YANKAUER SUCT BULB TIP NO VENT (SUCTIONS) ×3 IMPLANT

## 2015-12-24 NOTE — Anesthesia Procedure Notes (Addendum)
Procedure Name: MAC Date/Time: 12/24/2015 12:59 PM Performed by: Garrison Columbus T Pre-anesthesia Checklist: Patient identified, Emergency Drugs available, Suction available and Patient being monitored Patient Re-evaluated:Patient Re-evaluated prior to inductionOxygen Delivery Method: Simple face mask Preoxygenation: Pre-oxygenation with 100% oxygen Intubation Type: IV induction Placement Confirmation: positive ETCO2 and breath sounds checked- equal and bilateral Dental Injury: Teeth and Oropharynx as per pre-operative assessment    Spinal Patient location during procedure: OR Staffing Anesthesiologist: Nolon Nations Performed by: anesthesiologist  Preanesthetic Checklist Completed: patient identified, site marked, surgical consent, pre-op evaluation, timeout performed, IV checked, risks and benefits discussed and monitors and equipment checked Spinal Block Patient position: sitting Prep: ChloraPrep Patient monitoring: heart rate, continuous pulse ox and blood pressure Approach: right paramedian Location: L3-4 Injection technique: single-shot Needle Needle type: Sprotte  Needle gauge: 24 G Needle length: 9 cm Additional Notes Expiration date of kit checked and confirmed. Patient tolerated procedure well, without complications.

## 2015-12-24 NOTE — Transfer of Care (Signed)
Immediate Anesthesia Transfer of Care Note  Patient: Scott Cordova  Procedure(s) Performed: Procedure(s): RIGHT TOTAL HIP ARTHROPLASTY ANTERIOR APPROACH (Right)  Patient Location: PACU  Anesthesia Type:MAC and Spinal  Level of Consciousness: awake, alert  and oriented  Airway & Oxygen Therapy: Patient Spontanous Breathing and Patient connected to face mask oxygen  Post-op Assessment: Report given to RN and Post -op Vital signs reviewed and stable  Post vital signs: Reviewed and stable  Last Vitals:  Filed Vitals:   12/24/15 1133  BP: 127/78  Pulse: 90  Temp: 36.7 C  Resp: 20    Complications: No apparent anesthesia complications

## 2015-12-24 NOTE — Anesthesia Preprocedure Evaluation (Addendum)
Anesthesia Evaluation    Airway Mallampati: II  TM Distance: >3 FB Neck ROM: Full    Dental no notable dental hx. (+) Partial Lower, Poor Dentition, Missing, Chipped,    Pulmonary shortness of breath, asthma , sleep apnea , COPD,  COPD inhaler, former smoker,    Pulmonary exam normal breath sounds clear to auscultation       Cardiovascular hypertension, Pt. on medications Normal cardiovascular exam Rhythm:Regular Rate:Normal     Neuro/Psych    GI/Hepatic GERD  ,(+) Hepatitis -, C  Endo/Other    Renal/GU      Musculoskeletal  (+) Arthritis ,   Abdominal (+) + obese,   Peds  Hematology   Anesthesia Other Findings   Reproductive/Obstetrics                          Anesthesia Physical Anesthesia Plan  ASA: II  Anesthesia Plan: MAC and Spinal   Post-op Pain Management:    Induction: Intravenous  Airway Management Planned: Simple Face Mask  Additional Equipment:   Intra-op Plan:   Post-operative Plan:   Informed Consent: I have reviewed the patients History and Physical, chart, labs and discussed the procedure including the risks, benefits and alternatives for the proposed anesthesia with the patient or authorized representative who has indicated his/her understanding and acceptance.   Dental advisory given  Plan Discussed with: CRNA  Anesthesia Plan Comments:       Anesthesia Quick Evaluation

## 2015-12-24 NOTE — Progress Notes (Signed)
Utilization review completed.  

## 2015-12-24 NOTE — Discharge Instructions (Signed)
INSTRUCTIONS AFTER JOINT REPLACEMENT   o Remove items at home which could result in a fall. This includes throw rugs or furniture in walking pathways o ICE to the affected joint every three hours while awake for 30 minutes at a time, for at least the first 3-5 days, and then as needed for pain and swelling.  Continue to use ice for pain and swelling. You may notice swelling that will progress down to the foot and ankle.  This is normal after surgery.  Elevate your leg when you are not up walking on it.   o Continue to use the breathing machine you got in the hospital (incentive spirometer) which will help keep your temperature down.  It is common for your temperature to cycle up and down following surgery, especially at night when you are not up moving around and exerting yourself.  The breathing machine keeps your lungs expanded and your temperature down.   DIET:  As you were doing prior to hospitalization, we recommend a well-balanced diet.  DRESSING / WOUND CARE / SHOWERING  Keep your incision dry and covered until follow up  ACTIVITY  o Increase activity slowly as tolerated, but follow the weight bearing instructions below.   o No driving for 6 weeks or until further direction given by your physician.  You cannot drive while taking narcotics.  o No lifting or carrying greater than 10 lbs. until further directed by your surgeon. o Avoid periods of inactivity such as sitting longer than an hour when not asleep. This helps prevent blood clots.  o You may return to work once you are authorized by your doctor.     WEIGHT BEARING   Weight bearing as tolerated with assist device (walker, cane, etc) as directed, use it as long as suggested by your surgeon or therapist, typically at least 4-6 weeks.   EXERCISES  Results after joint replacement surgery are often greatly improved when you follow the exercise, range of motion and muscle strengthening exercises prescribed by your doctor. Safety  measures are also important to protect the joint from further injury. Any time any of these exercises cause you to have increased pain or swelling, decrease what you are doing until you are comfortable again and then slowly increase them. If you have problems or questions, call your caregiver or physical therapist for advice.   Rehabilitation is important following a joint replacement. After just a few days of immobilization, the muscles of the leg can become weakened and shrink (atrophy).  These exercises are designed to build up the tone and strength of the thigh and leg muscles and to improve motion. Often times heat used for twenty to thirty minutes before working out will loosen up your tissues and help with improving the range of motion but do not use heat for the first two weeks following surgery (sometimes heat can increase post-operative swelling).   These exercises can be done on a training (exercise) mat, on the floor, on a table or on a bed. Use whatever works the best and is most comfortable for you.    Use music or television while you are exercising so that the exercises are a pleasant break in your day. This will make your life better with the exercises acting as a break in your routine that you can look forward to.   Perform all exercises about fifteen times, three times per day or as directed.  You should exercise both the operative leg and the other leg as  well.  Exercises include:    Quad Sets - Tighten up the muscle on the front of the thigh (Quad) and hold for 5-10 seconds.    Straight Leg Raises - With your knee straight (if you were given a brace, keep it on), lift the leg to 60 degrees, hold for 3 seconds, and slowly lower the leg.  Perform this exercise against resistance later as your leg gets stronger.   Leg Slides: Lying on your back, slowly slide your foot toward your buttocks, bending your knee up off the floor (only go as far as is comfortable). Then slowly slide your  foot back down until your leg is flat on the floor again.   Angel Wings: Lying on your back spread your legs to the side as far apart as you can without causing discomfort.   Hamstring Strength:  Lying on your back, push your heel against the floor with your leg straight by tightening up the muscles of your buttocks.  Repeat, but this time bend your knee to a comfortable angle, and push your heel against the floor.  You may put a pillow under the heel to make it more comfortable if necessary.   A rehabilitation program following joint replacement surgery can speed recovery and prevent re-injury in the future due to weakened muscles. Contact your doctor or a physical therapist for more information on knee rehabilitation.    CONSTIPATION  Constipation is defined medically as fewer than three stools per week and severe constipation as less than one stool per week.  Even if you have a regular bowel pattern at home, your normal regimen is likely to be disrupted due to multiple reasons following surgery.  Combination of anesthesia, postoperative narcotics, change in appetite and fluid intake all can affect your bowels.   YOU MUST use at least one of the following options; they are listed in order of increasing strength to get the job done.  They are all available over the counter, and you may need to use some, POSSIBLY even all of these options:    Drink plenty of fluids (prune juice may be helpful) and high fiber foods Colace 100 mg by mouth twice a day  Senokot for constipation as directed and as needed Dulcolax (bisacodyl), take with full glass of water  Miralax (polyethylene glycol) once or twice a day as needed.  If you have tried all these things and are unable to have a bowel movement in the first 3-4 days after surgery call either your surgeon or your primary doctor.    If you experience loose stools or diarrhea, hold the medications until you stool forms back up.  If your symptoms do not get  better within 1 week or if they get worse, check with your doctor.  If you experience "the worst abdominal pain ever" or develop nausea or vomiting, please contact the office immediately for further recommendations for treatment.   ITCHING:  If you experience itching with your medications, try taking only a single pain pill, or even half a pain pill at a time.  You can also use Benadryl over the counter for itching or also to help with sleep.   TED HOSE STOCKINGS:  Use stockings on both legs until for at least 2 weeks or as directed by physician office. They may be removed at night for sleeping.  MEDICATIONS:  See your medication summary on the After Visit Summary that nursing will review with you.  You may have some home medications  which will be placed on hold until you complete the course of blood thinner medication.  It is important for you to complete the blood thinner medication as prescribed.  PRECAUTIONS:  If you experience chest pain or shortness of breath - call 911 immediately for transfer to the hospital emergency department.   If you develop a fever greater that 101 F, purulent drainage from wound, increased redness or drainage from wound, foul odor from the wound/dressing, or calf pain - CONTACT YOUR SURGEON.                                                   FOLLOW-UP APPOINTMENTS:  If you do not already have a post-op appointment, please call the office for an appointment to be seen by your surgeon.  Guidelines for how soon to be seen are listed in your After Visit Summary, but are typically between 1-4 weeks after surgery.  OTHER INSTRUCTIONS:   Knee Replacement:  Do not place pillow under knee, focus on keeping the knee straight while resting. CPM instructions: 0-90 degrees, 2 hours in the morning, 2 hours in the afternoon, and 2 hours in the evening. Place foam block, curve side up under heel at all times except when in CPM or when walking.  DO NOT modify, tear, cut, or change  the foam block in any way.  MAKE SURE YOU:   Understand these instructions.   Get help right away if you are not doing well or get worse.    Thank you for letting us be a part of your medical care team.  It is a privilege we respect greatly.  We hope these instructions will help you stay on track for a fast and full recovery!

## 2015-12-24 NOTE — Progress Notes (Signed)
Report to c. Alferd ApaMoseley RN as primary caregiver

## 2015-12-24 NOTE — Interval H&P Note (Signed)
History and Physical Interval Note:  12/24/2015 11:30 AM  Scott Cordova  has presented today for surgery, with the diagnosis of OA/AVN RT HIP  The various methods of treatment have been discussed with the patient and family. After consideration of risks, benefits and other options for treatment, the patient has consented to  Procedure(s): TOTAL HIP ARTHROPLASTY ANTERIOR APPROACH (Right) as a surgical intervention .  The patient's history has been reviewed, patient examined, no change in status, stable for surgery.  I have reviewed the patient's chart and labs.  Questions were answered to the patient's satisfaction.     [name] D

## 2015-12-24 NOTE — Op Note (Signed)
12/24/2015  2:14 PM  PATIENT:  Scott Cordova   MRN: [id number]  PRE-OPERATIVE DIAGNOSIS:  OA/AVN RT HIP  POST-OPERATIVE DIAGNOSIS:  * No post-op diagnosis entered *  PROCEDURE:  Procedure(s): RIGHT TOTAL HIP ARTHROPLASTY ANTERIOR APPROACH  PREOPERATIVE INDICATIONS:    Scott Cordova is an 60 y.o. male who has a diagnosis of <principal problem not specified> and elected for surgical management after failing conservative treatment.  The risks benefits and alternatives were discussed with the patient including but not limited to the risks of nonoperative treatment, versus surgical intervention including infection, bleeding, nerve injury, periprosthetic fracture, the need for revision surgery, dislocation, leg length discrepancy, blood clots, cardiopulmonary complications, morbidity, mortality, among others, and they were willing to proceed.     OPERATIVE REPORT     SURGEON:   MURPHY, TIMOTHY D, MD    ASSISTANT:  none    ANESTHESIA:  General    COMPLICATIONS:  None.     COMPONENTS:  Stryker acolade fit femur size 5 with a 36 mm +0 head ball and a PSL acetabular shell size 56 with a  polyethylene liner    PROCEDURE IN DETAIL:   The patient was met in the holding area and  identified.  The appropriate hip was identified and marked at the operative site.  The patient was then transported to the OR  and  placed under general anesthesia.  At that point, the patient was  placed in the supine position and  secured to the operating room table and all bony prominences padded. He received pre-operative antibiotics    The operative lower extremity was prepped from the iliac crest to the distal leg.  Sterile draping was performed.  Time out was performed prior to incision.      Skin incision was made just 2 cm lateral to the ASIS  extending in line with the tensor fascia lata. Electrocautery was used to control all bleeders. I dissected down sharply to the fascia of the tensor fascia lata was  confirmed that the muscle fibers beneath were running posteriorly. I then incised the fascia over the superficial tensor fascia lata in line with the incision. The fascia was elevated off the anterior aspect of the muscle the muscle was retracted posteriorly and protected throughout the case. I then used electrocautery to incise the tensor fascia lata fascia control and all bleeders. Immediately visible was the fat over top of the anterior neck and capsule.  I removed the anterior fat from the capsule and elevated the rectus muscle off of the anterior capsule. I then removed a large time of capsule. The retractors were then placed over the anterior acetabulum as well as around the superior and inferior neck.  I then removed a section of the femoral neck and a napkin ring fashion. Then used the power course to remove the femoral head from the acetabulum and thoroughly irrigated the acetabulum. I sized the femoral head.    I then exposed the deep acetabulum, cleared out any tissue including the ligamentum teres.   After adequate visualization, I excised the labrum, and then sequentially reamed.  I placed the trial acetabulum, which seated nicely, and then impacted the real cup into place.  Appropriate version and inclination was confirmed clinically matching their bony anatomy, and also with the use transverse acetabular ligament.  I placed a 37m screw in the posterior/superio position with an excellent bite.    I then placed the polyethylene liner in place  I then abducted the leg and  released the external rotators from the posterior femur allowing it to be easily delivered up lateral and anterior to the acetabulum for preparation of the femoral canal.    I then prepared the proximal femur using the cookie-cutter and then sequentially reamed and broached.  A trial broach, neck, and head was utilized, and I reduced the hip and it was found to have excellent stability with functional range of  motion..  I then impacted the real femoral prosthesis into place into the appropriate version, slightly anteverted to the normal anatomy, and I impacted the real head ball into place. The hip was then reduced and taken through functional range of motion and found to have excellent stability. Leg lengths were restored.  I then irrigated the hip copiously again with, and repaired the fascia with Vicryl, followed by monocryl for the subcutaneous tissue, Monocryl for the skin, Steri-Strips and sterile gauze. The patient was then awakened and returned to PACU in stable and satisfactory condition. There were no complications.  POST OPERATIVE PLAN: WBAT, DVT px: SCD's/TED and ASA 325  Scott Lynch, MD Orthopedic Surgeon [phone]   This note was generated using a template and dragon dictation system. In light of that, I have reviewed the note and all aspects of it are applicable to this case. Any dictation errors are due to the computerized dictation system.

## 2015-12-24 NOTE — Anesthesia Postprocedure Evaluation (Signed)
Anesthesia Post Note  Patient: Scott Cordova  Procedure(s) Performed: Procedure(s) (LRB): RIGHT TOTAL HIP ARTHROPLASTY ANTERIOR APPROACH (Right)  Patient location during evaluation: PACU Anesthesia Type: Spinal and MAC Level of consciousness: awake and alert Pain management: pain level controlled Vital Signs Assessment: post-procedure vital signs reviewed and stable Respiratory status: spontaneous breathing and respiratory function stable Cardiovascular status: blood pressure returned to baseline and stable Postop Assessment: spinal receding Anesthetic complications: no    Last Vitals:  Filed Vitals:   12/24/15 1805 12/24/15 2100  BP: 119/85 103/60  Pulse: 76 104  Temp: 36.7 C 36.7 C  Resp: 20 19    Last Pain:  Filed Vitals:   12/24/15 2143  PainSc: 0-No pain                 Lewie LoronJohn Zaray Gatchel

## 2015-12-25 ENCOUNTER — Encounter (HOSPITAL_COMMUNITY): Payer: Self-pay | Admitting: Orthopedic Surgery

## 2015-12-25 NOTE — Evaluation (Signed)
Occupational Therapy Evaluation Patient Details Name: Scott Cordova MRN: [id number] DOB: Jul 06, 1956 Today's Date: 12/25/2015    History of Present Illness Pt is a 60 y.o. male s/p R THA direct anterior approach. PMHx: COPD, HTN, Arithrits, Hep C.    Clinical Impression   Pt reports he was independent with ADLs PTA. Currently pt is overall min assist for functional mobility and ADLs. Began safety and ADL education with pt and daughter. Pt planning to d/c home with family assist. Recommending HHOT for follow up in order to maximize independence and safety with ADLs and functional mobility. Pt would benefit from continued skilled OT to address established goals.    Follow Up Recommendations  Home health OT;Supervision/Assistance - 24 hour    Equipment Recommendations  3 in 1 bedside comode    Recommendations for Other Services       Precautions / Restrictions Precautions Precautions: None Precaution Comments: Direct anterior-no precautions Restrictions Weight Bearing Restrictions: Yes RLE Weight Bearing: Weight bearing as tolerated      Mobility Bed Mobility Overal bed mobility: Needs Assistance Bed Mobility: Supine to Sit     Supine to sit: Min guard     General bed mobility comments: Min guard for safety with cues for positioning and technique, increased time to perform, no physical assist required but increased effort noted  Transfers Overall transfer level: Needs assistance Equipment used: Rolling walker (2 wheeled) Transfers: Sit to/from Stand Sit to Stand: Min assist         General transfer comment: VCs for hand placement and safety with use of RW to elevate to upright. Min assist for stability with patient noted to have difficulty during power up initially, min guard from chair after cues.    Balance Overall balance assessment: Needs assistance Sitting-balance support: Feet supported;No upper extremity supported Sitting balance-Leahy Scale: Good      Standing balance support: Bilateral upper extremity supported;During functional activity Standing balance-Leahy Scale: Poor Standing balance comment: relies on RW                            ADL Overall ADL's : Needs assistance/impaired Eating/Feeding: Set up;Sitting   Grooming: Min guard;Standing   Upper Body Bathing: Supervision/ safety;Sitting   Lower Body Bathing: Minimal assistance;Sit to/from stand   Upper Body Dressing : Supervision/safety;Sitting   Lower Body Dressing: Moderate assistance;Sit to/from stand Lower Body Dressing Details (indicate cue type and reason): Pt unable to reach bilateral feet for donning socks. Daughter reports she can assist as needed. Educated on compensatory strategies for LB ADLs. Toilet Transfer: Minimal assistance;Ambulation;BSC;RW Toilet Transfer Details (indicate cue type and reason): Simulated by transfer from EOB Toileting- Clothing Manipulation and Hygiene: Minimal assistance;Sit to/from Nurse, children's Details (indicate cue type and reason): Educated pt and daughter on walk in shower transfer technique and need for close supervision for safety with shower transfers; they verbalized understanding. Functional mobility during ADLs: Minimal assistance;Rolling walker General ADL Comments: Pts daughter present for OT eval. Educated pt and daughter on home safety, WB status, no precautions with direct anterior hip.     Vision Vision Assessment?: No apparent visual deficits   Perception     Praxis      Pertinent Vitals/Pain Pain Assessment: 0-10 Pain Score: 5  Pain Location: R hip Pain Descriptors / Indicators: Sore Pain Intervention(s): Limited activity within patient's tolerance;Monitored during session;Repositioned     Hand Dominance     Extremity/Trunk Assessment Upper  Extremity Assessment Upper Extremity Assessment: Overall WFL for tasks assessed   Lower Extremity Assessment Lower Extremity  Assessment: Defer to PT evaluation RLE: Unable to fully assess due to pain       Communication Communication Communication: No difficulties   Cognition Arousal/Alertness: Awake/alert Behavior During Therapy: WFL for tasks assessed/performed Overall Cognitive Status: Within Functional Limits for tasks assessed                     General Comments       Exercises       Shoulder Instructions      Home Living Family/patient expects to be discharged to:: Private residence Living Arrangements: Children Available Help at Discharge: Family Type of Home: House Home Access: Stairs to enter Secretary/administratorntrance Stairs-Number of Steps: 5 Entrance Stairs-Rails: Can reach both Home Layout: One level     Bathroom Shower/Tub: Tub/shower unit;Walk-in shower (will use walk-in initially)   Bathroom Toilet: Standard Bathroom Accessibility: Yes How Accessible: Accessible via wheelchair Home Equipment: Cane - single point;Wheelchair - manual;Shower seat - built in          Prior Functioning/Environment Level of Independence: Independent with assistive device(s)        Comments: cane for mobility    OT Diagnosis: Generalized weakness;Acute pain   OT Problem List: Decreased strength;Decreased activity tolerance;Impaired balance (sitting and/or standing);Decreased knowledge of use of DME or AE;Decreased knowledge of precautions;Pain   OT Treatment/Interventions: Self-care/ADL training;Energy conservation;DME and/or AE instruction;Therapeutic activities;Patient/family education;Balance training    OT Goals(Current goals can be found in the care plan section) Acute Rehab OT Goals Patient Stated Goal: to go home OT Goal Formulation: With patient/family Time For Goal Achievement: 01/08/16 Potential to Achieve Goals: Good ADL Goals Pt Will Perform Grooming: with supervision;standing Pt Will Perform Lower Body Bathing: with supervision;sit to/from stand Pt Will Perform Lower Body  Dressing: with supervision;sit to/from stand Pt Will Transfer to Toilet: with supervision;ambulating;bedside commode (over toilet) Pt Will Perform Toileting - Clothing Manipulation and hygiene: with supervision;sit to/from stand Pt Will Perform Tub/Shower Transfer: Shower transfer;with supervision;ambulating;shower seat;rolling walker  OT Frequency: Min 2X/week   Barriers to D/C:            Co-evaluation PT/OT/SLP Co-Evaluation/Treatment: Yes Reason for Co-Treatment: For patient/therapist safety PT goals addressed during session: Mobility/safety with mobility;Balance;Proper use of DME OT goals addressed during session: ADL's and self-care      End of Session Equipment Utilized During Treatment: Gait belt;Rolling walker  Activity Tolerance: Patient tolerated treatment well Patient left: in chair;with call bell/phone within reach;with family/visitor present   Time: 4132-44010749-0821 OT Time Calculation (min): 32 min Charges:  OT General Charges $OT Visit: 1 Procedure OT Evaluation $OT Eval Moderate Complexity: 1 Procedure G-Codes:     Gaye AlkenBailey A Maritssa Haughton M.S., OTR/L Pager: [phone]  12/25/2015, 9:20 AM

## 2015-12-25 NOTE — Discharge Summary (Signed)
Physician Discharge Summary  Patient ID: Scott Cordova MRN: [id number] DOB/AGE: 03-30-1956 60 y.o.  Admit date: 12/24/2015 Discharge date: 12/25/2015  Admission Diagnoses:  <principal problem not specified>  Discharge Diagnoses:  Active Problems:   AVN (avascular necrosis of bone) (HCC)   Past Medical History  Diagnosis Date  . Asthma   . COPD (chronic obstructive pulmonary disease) (HCC)   . Collar bone fracture   . Broken ribs   . Hypertension   . Sleep apnea 2014    [facility]- told that he didn't need it   . Shortness of breath dyspnea   . Arthritis     OA - both hips, hands  . Hepatitis C 08/13/14    patient reported    Surgeries: Procedure(s): RIGHT TOTAL HIP ARTHROPLASTY ANTERIOR APPROACH on 12/24/2015   Consultants (if any):    Discharged Condition: Improved  Hospital Course: Scott Cordova is an 60 y.o. male who was admitted 12/24/2015 with a diagnosis of <principal problem not specified> and went to the operating room on 12/24/2015 and underwent the above named procedures.    He was given perioperative antibiotics:  Anti-infectives    Start     Dose/Rate Route Frequency Ordered Stop   12/24/15 2000  clindamycin (CLEOCIN) IVPB 600 mg     600 mg 100 mL/hr over 30 Minutes Intravenous Every 6 hours 12/24/15 1829 12/25/15 0417   12/24/15 1230  clindamycin (CLEOCIN) IVPB 900 mg     900 mg 100 mL/hr over 30 Minutes Intravenous To ShortStay Surgical 12/24/15 1229 12/24/15 1325   12/24/15 1056  ceFAZolin (ANCEF) 2-3 GM-% IVPB SOLR  Status:  Discontinued    Comments:  Elliott, Beth   : cabinet override      12/24/15 1056 12/24/15 1128   12/24/15 1047  ceFAZolin (ANCEF) IVPB 2 g/50 mL premix  Status:  Discontinued     2 g 100 mL/hr over 30 Minutes Intravenous On call to O.R. 12/24/15 1047 12/24/15 1820    .  He was given sequential compression devices, early ambulation, and ASA 325 for DVT prophylaxis.  He benefited maximally from  the hospital stay and there were no complications.    Recent vital signs:  Filed Vitals:   12/24/15 2100 12/25/15 0500  BP: 103/60 98/70  Pulse: 104 105  Temp: 98.1 F (36.7 C) 100.9 F (38.3 C)  Resp: 19 19    Recent laboratory studies:  Lab Results  Component Value Date   HGB 17.0 12/12/2015   HGB 15.4 11/08/2015   HGB 15.0 08/13/2014   Lab Results  Component Value Date   WBC 5.5 12/12/2015   PLT 190 12/12/2015   Lab Results  Component Value Date   INR 1.13 12/12/2015   Lab Results  Component Value Date   NA 139 12/12/2015   K 3.7 12/12/2015   CL 104 12/12/2015   CO2 26 12/12/2015   BUN 18 12/12/2015   CREATININE 0.86 12/12/2015   GLUCOSE 95 12/12/2015    Discharge Medications:     Medication List    STOP taking these medications        aspirin 81 MG tablet  Replaced by:  aspirin EC 325 MG tablet      TAKE these medications        albuterol 108 (90 Base) MCG/ACT inhaler  Commonly known as:  PROVENTIL HFA;VENTOLIN HFA  Inhale 2 puffs into the lungs every 4 (four) hours as needed. Shortness of breath  amLODipine 5 MG tablet  Commonly known as:  NORVASC  Take 1 tablet (5 mg total) by mouth daily.     aspirin EC 325 MG tablet  Take 1 tablet (325 mg total) by mouth daily.     atorvastatin 40 MG tablet  Commonly known as:  LIPITOR  Take 1 tablet (40 mg total) by mouth daily.     CENTRUM SILVER ADULT 50+ PO  Take 1 tablet by mouth over 48 hr.     COMBIVENT RESPIMAT 20-100 MCG/ACT Aers respimat  Generic drug:  Ipratropium-Albuterol  INHALE ONE PUFF BY MOUTH INTO THE LUNGS EVERY 6 HOURS AS NEEDED FOR WHEEZING.     diclofenac 75 MG EC tablet  Commonly known as:  VOLTAREN  Take 1 tablet (75 mg total) by mouth 2 (two) times daily.     docusate sodium 100 MG capsule  Commonly known as:  COLACE  Take 1 capsule (100 mg total) by mouth 2 (two) times daily. Continue this while taking narcotics to help with bowel movements      fluticasone-salmeterol 230-21 MCG/ACT inhaler  Commonly known as:  ADVAIR HFA  Inhale 2 puffs into the lungs 2 (two) times daily.     HYDROcodone-acetaminophen 5-325 MG tablet  Commonly known as:  NORCO  Take 1-2 tablets by mouth every 4 (four) hours as needed for moderate pain.     INCRUSE ELLIPTA 62.5 MCG/INH Aepb  Generic drug:  umeclidinium bromide  Inhale 1 puff into the lungs daily.     magnesium gluconate 500 MG tablet  Commonly known as:  MAGONATE  Take 500 mg by mouth every other day. Alternates MVT with other supplements.     methocarbamol 500 MG tablet  Commonly known as:  ROBAXIN  Take 1 tablet (500 mg total) by mouth every 6 (six) hours as needed.     ondansetron 4 MG tablet  Commonly known as:  ZOFRAN  Take 1 tablet (4 mg total) by mouth every 8 (eight) hours as needed for nausea.     Potassium Gluconate 595 MG Tbcr  Take 1 tablet by mouth daily.     VITAMIN B 12 PO  Take 2,500 mcg by mouth daily.     Vitamin D (Cholecalciferol) 1000 units Tabs  Take 2,000 Units by mouth every morning.     vitamin E 100 UNIT capsule  Take 400 Units by mouth daily.        Diagnostic Studies: Dg Hip Operative Unilat With Pelvis Right  12/24/2015  CLINICAL DATA:  60 year old male status post right total hip arthroplasty. Initial encounter. EXAM: OPERATIVE RIGHT HIP (WITH PELVIS IF PERFORMED) TWO VIEWS TECHNIQUE: Fluoroscopic spot image(s) were submitted for interpretation post-operatively. COMPARISON:  None. FINDINGS: Two intraoperative fluoroscopic views. Right total hip arthroplasty hardware in place. Components appear normally aligned on these AP views. No unexpected osseous changes identified. FLUOROSCOPY TIME:  0 minutes 8 seconds IMPRESSION: Right total hip arthroplasty with no adverse features. Electronically Signed   By: Odessa FlemingH  Hall M.D.   On: 12/24/2015 14:34    Disposition: 01-Home or Self Care        Follow-up Information    Follow up with Sheral ApleyMURPHY, Danisa Kopec D, MD.    Specialty:  Orthopedic Surgery   Why:  as scheduled   Contact information:   [address] [place] [id number] [phone]        Signed: Sheral ApleyMURPHY, Milanya Sunderland D 12/25/2015, 8:06 AM

## 2015-12-25 NOTE — Progress Notes (Signed)
Physical Therapy Treatment Patient Details Name: Scott Cordova MRN: [id number] DOB: 12-04-1955 Today's Date: 12/25/2015    History of Present Illness Pt is a 60 y.o. male s/p R THA direct anterior approach. PMHx: COPD, HTN, Arithrits, Hep C.     PT Comments    Patient progressing well with mobility, performed increased ambulation, stair negotiation and reviewed car transfers and activity for discharge. Patient functional status adequate for d/c home with family. Nsg advised.  Follow Up Recommendations  Home health PT;Supervision/Assistance - 24 hour     Equipment Recommendations  Rolling walker with 5" wheels;3in1 (PT)    Recommendations for Other Services       Precautions / Restrictions Precautions Precautions: None Precaution Comments: Direct anterior-no precautions Restrictions Weight Bearing Restrictions: Yes RLE Weight Bearing: Weight bearing as tolerated    Mobility  Bed Mobility               General bed mobility comments: received in chair  Transfers Overall transfer level: Needs assistance Equipment used: Rolling walker (2 wheeled) Transfers: Sit to/from Stand Sit to Stand: Supervision         General transfer comment: No physical assist required, performed x3  Ambulation/Gait Ambulation/Gait assistance: Supervision Ambulation Distance (Feet): 110 Feet Assistive device: Rolling walker (2 wheeled) Gait Pattern/deviations: Step-to pattern;Decreased stride length;Trunk flexed;Narrow base of support;Antalgic Gait velocity: decrease Gait velocity interpretation: Below normal speed for age/gender General Gait Details: improved gait function, increased tolerance. (noted audible crepitus with movement)   Stairs Stairs: Yes Stairs assistance: Min guard Stair Management: Step to pattern;Two rails;Forwards Number of Stairs: 4 General stair comments: VCs for technique and sequencing  Wheelchair Mobility    Modified Rankin (Stroke Patients Only)        Balance     Sitting balance-Leahy Scale: Good       Standing balance-Leahy Scale: Fair Standing balance comment: heavy reliance on UE support                    Cognition Arousal/Alertness: Awake/alert Behavior During Therapy: WFL for tasks assessed/performed Overall Cognitive Status: Within Functional Limits for tasks assessed                      Exercises      General Comments General comments (skin integrity, edema, etc.): Educated on technique for car transfers and answer questions related to HEP and mobility for discharge.      Pertinent Vitals/Pain Pain Assessment: 0-10 Pain Score: 3  Pain Location: right hip Pain Descriptors / Indicators: Sore Pain Intervention(s): Monitored during session    Home Living                      Prior Function            PT Goals (current goals can now be found in the care plan section) Acute Rehab PT Goals Patient Stated Goal: to go home PT Goal Formulation: With patient/family Time For Goal Achievement: 01/08/16 Potential to Achieve Goals: Good Progress towards PT goals: Progressing toward goals    Frequency  7X/week    PT Plan Current plan remains appropriate    Co-evaluation             End of Session Equipment Utilized During Treatment: Gait belt Activity Tolerance: Patient limited by fatigue;Patient limited by pain Patient left: in chair;with call bell/phone within reach;with family/visitor present     Time: 1340-1359 PT Time Calculation (min) (ACUTE ONLY): 19 min  Charges:  $Gait Training: 8-22 mins                    G CodesFabio Asa 2016/01/16, 3:36 PM Charlotte Crumb, PT DPT  [phone]

## 2015-12-25 NOTE — Progress Notes (Signed)
Discharge instructions given. Pt verbalized understanding and all questions were answered.  

## 2015-12-25 NOTE — Care Management Note (Signed)
Case Management Note  Patient Details  Name: Currie Parisyrone Martelle MRN: [id number] Date of Birth: Feb 14, 1956  Subjective/Objective:  60 yr old male s/pright total hip arthroplasty.                  Action/Plan: Case manager spoke with patient and his daughter at the bedside concerning home health and DME needs at discharge. Choice was offered for Home Health Agency. Referral was called to Rayburn GoSusan Hamilton,RN [facility]. Patient states he will have family support at discharge. Rolling walker and 3in1 have been delivered to patient's room. No further case management needs.    Expected Discharge Date:  12/26/15               Expected Discharge Plan:  Home w Home Health Services  In-House Referral:     Discharge planning Services  CM Consult  Post Acute Care Choice:  Durable Medical Equipment, Home Health Choice offered to:  Patient, Adult Children  DME Arranged:  3-N-1, Walker rolling DME Agency:  [facility]  HH Arranged:  PT HH Agency:  [facility]  Status of Service:  Completed, signed off  Medicare Important Message Given:    Date Medicare IM Given:    Medicare IM give by:    Date Additional Medicare IM Given:    Additional Medicare Important Message give by:     If discussed at Long Length of Stay Meetings, dates discussed:    Additional Comments:  Durenda GuthrieBrady, Sriansh Farra Naomi, RN 12/25/2015, 10:36 AM

## 2015-12-25 NOTE — Evaluation (Signed)
Physical Therapy Evaluation Patient Details Name: Scott Cordova MRN: [id number] DOB: 1956/02/16 Today's Date: 12/25/2015   History of Present Illness  Pt is a 60 y.o. male s/p R THA direct anterior approach. PMHx: COPD, HTN, Arithrits, Hep C.   Clinical Impression  Patient demonstrates deficits in functional mobility as indicated below. Will need continued skilled PT to address deficits and maximize function. Will see as indicated and progress as tolerated. Will see this pm for further mobility training and preparation for d/c home with family.    Follow Up Recommendations Home health PT;Supervision/Assistance - 24 hour    Equipment Recommendations  Rolling walker with 5" wheels;3in1 (PT)    Recommendations for Other Services       Precautions / Restrictions Precautions Precautions: None Precaution Comments: Direct anterior-no precautions Restrictions Weight Bearing Restrictions: Yes RLE Weight Bearing: Weight bearing as tolerated      Mobility  Bed Mobility Overal bed mobility: Needs Assistance Bed Mobility: Supine to Sit     Supine to sit: Min guard     General bed mobility comments: Min guard for safety with cues for positioning and technique, increased time to perform, no physical assist required but increased effort noted  Transfers Overall transfer level: Needs assistance Equipment used: Rolling walker (2 wheeled) Transfers: Sit to/from Stand Sit to Stand: Min assist         General transfer comment: VCs for hand placement and safety with use of RW to elevate to upright. Min assist for stability with patient noted to have difficulty during power up initially, min guard from chair after cues.  Ambulation/Gait Ambulation/Gait assistance: Min assist Ambulation Distance (Feet): 60 Feet Assistive device: Rolling walker (2 wheeled) Gait Pattern/deviations: Step-to pattern;Decreased stride length;Trunk flexed;Narrow base of support;Antalgic Gait velocity:  decrease Gait velocity interpretation: <1.8 ft/sec, indicative of risk for recurrent falls General Gait Details: heavy reliance on UE support, multiple standing rest breaks secondary to fatigue of UEs. Increased time with steps, and limited overall activity tolerance. Cues for sequencing and cadence.  (noted audible crepitus with movement)  Stairs            Wheelchair Mobility    Modified Rankin (Stroke Patients Only)       Balance Overall balance assessment: Needs assistance   Sitting balance-Leahy Scale: Good     Standing balance support: During functional activity;Bilateral upper extremity supported   Standing balance comment: heavy reliance on RW                             Pertinent Vitals/Pain Pain Assessment: 0-10 Pain Score: 5  Pain Location: R hip Pain Descriptors / Indicators: Sore Pain Intervention(s): Limited activity within patient's tolerance;Monitored during session;Repositioned    Home Living Family/patient expects to be discharged to:: Private residence Living Arrangements: Children Available Help at Discharge: Family Type of Home: House Home Access: Stairs to enter Entrance Stairs-Rails: Can reach both Entrance Stairs-Number of Steps: 5 Home Layout: One level Home Equipment: Cane - single point;Wheelchair - Manufacturing systems engineer - built in      Prior Function Level of Independence: Independent with assistive device(s)         Comments: cane for mobility     Hand Dominance        Extremity/Trunk Assessment   Upper Extremity Assessment: Overall WFL for tasks assessed           Lower Extremity Assessment: RLE deficits/detail  Communication   Communication: No difficulties  Cognition Arousal/Alertness: Awake/alert Behavior During Therapy: WFL for tasks assessed/performed Overall Cognitive Status: Within Functional Limits for tasks assessed                      General Comments General comments  (skin integrity, edema, etc.): educated on importance of mobility, highly encourage patient to ambulate to bathroom     Exercises        Assessment/Plan    PT Assessment Patient needs continued PT services  PT Diagnosis Difficulty walking;Abnormality of gait;Acute pain   PT Problem List Decreased strength;Decreased range of motion;Decreased activity tolerance;Decreased balance;Decreased mobility;Decreased coordination;Decreased knowledge of use of DME;Decreased safety awareness;Pain  PT Treatment Interventions DME instruction;Gait training;Stair training;Functional mobility training;Therapeutic activities;Therapeutic exercise;Balance training;Patient/family education   PT Goals (Current goals can be found in the Care Plan section) Acute Rehab PT Goals Patient Stated Goal: to go home PT Goal Formulation: With patient/family Time For Goal Achievement: 01/08/16 Potential to Achieve Goals: Good    Frequency 7X/week   Barriers to discharge        Co-evaluation PT/OT/SLP Co-Evaluation/Treatment: Yes Reason for Co-Treatment: For patient/therapist safety PT goals addressed during session: Mobility/safety with mobility;Balance;Proper use of DME OT goals addressed during session: ADL's and self-care       End of Session Equipment Utilized During Treatment: Gait belt Activity Tolerance: Patient limited by fatigue;Patient limited by pain Patient left: in chair;with call bell/phone within reach;with family/visitor present Nurse Communication: Mobility status         Time: 4098-11910749-0820 PT Time Calculation (min) (ACUTE ONLY): 31 min   Charges:   PT Evaluation $PT Eval Moderate Complexity: 1 Procedure     PT G CodesFabio Asa:        [name] J 12/25/2015, 8:45 AM [name], PT DPT  [phone]

## 2015-12-30 HISTORY — PX: TOTAL HIP ARTHROPLASTY: SHX124

## 2016-02-06 ENCOUNTER — Other Ambulatory Visit: Payer: Self-pay | Admitting: Family Medicine

## 2016-03-06 NOTE — H&P (Deleted)
PREOPERATIVE H&P Patient ID: Scott Cordova MRN: [id number] DOB/AGE: Jan 10, 1956 60 y.o.  Chief Complaint: OSTEOARTHRITIS AVASCULAR NECROSIS LEFT HIP  Planned Procedure Date: 03/31/16 Medical and Cardiac Clearance by Dr. Randolm IdolFletke.  HPI: Scott Parisyrone Sol is a 60 y.o. male who presents for evaluation of OSTEOARTHRITIS AVASCULAR NECROSIS LEFT HIP. The patient has a history of pain and functional disability in the left hip due to arthritis and has failed non-surgical conservative treatments for greater than 12 weeks to include NSAID's and/or analgesics, corticosteriod injections, supervised PT with diminished ADL's post treatment, use of assistive devices and activity modification.  Onset of symptoms was gradual, starting 2 years ago with rapidlly worsening course since that time. The patient noted no past surgery on the left hip.  Patient currently rates pain at 8 out of 10 with activity. Patient has night pain, worsening of pain with activity and weight bearing, pain that interferes with activities of daily living, pain with passive range of motion and crepitus.  Patient has evidence of severe AVN collapse and deformity of the left hip by imaging studies.  There is no active infection.  Past Medical History  Diagnosis Date  . Asthma   . COPD (chronic obstructive pulmonary disease) (HCC)   . Collar bone fracture   . Broken ribs   . Hypertension   . Sleep apnea 2014    [facility] [place] told that he didn't need it   . Shortness of breath dyspnea   . Arthritis     OA - both hips, hands  . Hepatitis C 08/13/14    patient reported   Past Surgical History  Procedure Laterality Date  . Tonsillectomy    . Hernia repair Right     as a child- on R side- inguinal   . Total hip arthroplasty Right 12/24/2015    Procedure: RIGHT TOTAL HIP ARTHROPLASTY ANTERIOR APPROACH;  Surgeon: Sheral Apleyimothy D Murphy, MD;  Location: [facility] OR;  Service: Orthopedics;  Laterality: Right;   Allergies   Allergen Reactions  . Penicillins Other (See Comments)    UNSPECIFIED, mother died from it Has patient had a PCN reaction causing immediate rash, facial/tongue/throat swelling, SOB or lightheadedness with hypotension: UNSPECIFIED Has patient had a PCN reaction causing severe rash involving mucus membranes or skin necrosis: No Has patient had a PCN reaction that required hospitalization No Has patient had a PCN reaction occurring within the last 10 years: No  . Lactose Intolerance (Gi) Diarrhea   Prior to Admission medications   Medication Sig Start Date End Date Taking? Authorizing Provider  albuterol (PROVENTIL HFA;VENTOLIN HFA) 108 (90 BASE) MCG/ACT inhaler Inhale 2 puffs into the lungs every 4 (four) hours as needed. Shortness of breath 08/27/15   Uvaldo RisingKyle J Fletke, MD  amLODipine (NORVASC) 5 MG tablet Take 1 tablet (5 mg total) by mouth daily. Patient taking differently: Take 5 mg by mouth at bedtime.  08/16/15   Uvaldo RisingKyle J Fletke, MD  aspirin EC 325 MG tablet Take 1 tablet (325 mg total) by mouth daily. 12/24/15   Sheral Apleyimothy D Murphy, MD  atorvastatin (LIPITOR) 40 MG tablet Take 1 tablet (40 mg total) by mouth daily. Patient taking differently: Take 40 mg by mouth daily after breakfast.  11/12/15   Uvaldo RisingKyle J Fletke, MD  COMBIVENT RESPIMAT 20-100 MCG/ACT AERS respimat INHALE ONE PUFF BY MOUTH INTO THE LUNGS EVERY 6 HOURS AS NEEDED FOR WHEEZING 02/07/16   Uvaldo RisingKyle J Fletke, MD  Cyanocobalamin (VITAMIN B 12 PO) Take 2,500 mcg by  mouth daily.     Historical Provider, MD  diclofenac (VOLTAREN) 75 MG EC tablet Take 1 tablet (75 mg total) by mouth 2 (two) times daily. 08/16/15   Uvaldo Rising, MD  docusate sodium (COLACE) 100 MG capsule Take 1 capsule (100 mg total) by mouth 2 (two) times daily. Continue this while taking narcotics to help with bowel movements 12/24/15   Sheral Apley, MD  fluticasone-salmeterol (ADVAIR HFA) [phone] MCG/ACT inhaler Inhale 2 puffs into the lungs 2 (two) times daily. 09/13/15   Uvaldo Rising, MD  HYDROcodone-acetaminophen (NORCO) 5-325 MG tablet Take 1-2 tablets by mouth every 4 (four) hours as needed for moderate pain. 12/24/15   Sheral Apley, MD  INCRUSE ELLIPTA 62.5 MCG/INH AEPB Inhale 1 puff into the lungs daily. 12/13/15   Uvaldo Rising, MD  magnesium gluconate (MAGONATE) 500 MG tablet Take 500 mg by mouth every other day. Alternates MVT with other supplements.    Historical Provider, MD  methocarbamol (ROBAXIN) 500 MG tablet Take 1 tablet (500 mg total) by mouth every 6 (six) hours as needed. 12/24/15   Sheral Apley, MD  Multiple Vitamins-Minerals (CENTRUM SILVER ADULT 50+ PO) Take 1 tablet by mouth over 48 hr.     Historical Provider, MD  ondansetron (ZOFRAN) 4 MG tablet Take 1 tablet (4 mg total) by mouth every 8 (eight) hours as needed for nausea. 12/24/15   Sheral Apley, MD  Potassium Gluconate 595 MG TBCR Take 1 tablet by mouth daily.     Historical Provider, MD  Vitamin D, Cholecalciferol, 1000 UNITS TABS Take 2,000 Units by mouth every morning.    Historical Provider, MD  vitamin E 100 UNIT capsule Take 400 Units by mouth daily.    Historical Provider, MD   Social History   Single, smoker: previously 1 1/4 ppd x20 years.  Still smokes occasionally.  Drinks alcohol intermittently - about 12 beers over a weekend.  Previous drug addiction - clean since 2000.  Lives with his daughter, her husband and their two children.   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 Alcohol abuse Father   . Hyperlipidemia Sister   . Hypertension Sister   . Alcohol abuse Sister   . Drug abuse Sister   . Diabetes Maternal Grandmother     ROS: Currently denies lightheadedness, dizziness, Fever, chills, CP.  Chronic SOB w/ exertion dt COPD.   No personal history  of DVT, PE, MI, or CVA. No loose teeth or dentures All other systems have been reviewed and were otherwise negative with the exception of those mentioned in the HPI and as above.  Objective: Vitals: Ht: 5'4" Wt: 185 Temp: 97.9 BP: 148/98 Pulse: 90 O2 96% on room air. Physical Exam: General: Alert, NAD. Trendelenberg Gait walks with walker. HEENT: EOMI, Good Neck Extension. Pulm: No increased work of breathing.  Clear B/L A/P w/o crackle or wheeze.   CV: RRR, No m/g/r appreciated  GI: soft, NT, ND Neuro: Neuro grossly intact b/l upper/lower ext.  Sensation intact distally Skin: No lesions in the area of chief complaint MSK/Surgical Site: Non tender over greater trochanter.  Pain with passive ROM.  Positive Stinchfield.  5/5 strength.  NVI.  Sensation intact distally.  Imaging Review Plain radiographs  demonstrate severe collapse with a superior acetabular cyst on the left side.    Assessment: OSTEOARTHRITIS AVASCULAR NECROSIS LEFT HIP  Plan: Plan for Procedure(s): TOTAL LEFT HIP ARTHROPLASTY ANTERIOR APPROACH  The patient history, physical examination, clinical judgement of the provider and imaging studies are consistent with end stage degenerative joint disease and hip joint arthroplasty is deemed medically necessary. The treatment options including medical management, injection therapy, arthroscopy and arthroplasty were discussed at length. The risks and benefits of Procedure(s): TOTAL LEFT HIP ARTHROPLASTY ANTERIOR APPROACH were presented and reviewed.  The risks of nonoperative treatment, versus surgical intervention including but not limited to continued pain, aseptic loosening, stiffness, dislocation/subluxation, infection, bleeding, nerve injury, blood clots, cardiopulmonary complications, morbidity, mortality, among others were discussed. The patient verbalizes understanding and wishes to proceed with the plan.  Patient is being admitted for inpatient treatment for surgery, pain  control, PT, OT, prophylactic antibiotics, VTE prophylaxis, progressive ambulation and ADL's and discharge planning.   Dental prophylaxis discussed and recommended for 2 years postoperatively.   The patient does meet the criteria for TXA which will be used perioperatively via IV.    ASA 325 mg will be used postoperatively for DVT prophylaxis in addition to SCDs, and early ambulation.  The patient is planning to be discharged home with home health services [facility] of His daughter.  Lucretia Kern Martensen III,PA-C 03/06/2016 6:08 PM

## 2016-03-17 ENCOUNTER — Encounter (HOSPITAL_COMMUNITY): Payer: Self-pay

## 2016-03-17 ENCOUNTER — Encounter (HOSPITAL_COMMUNITY)
Admission: RE | Admit: 2016-03-17 | Discharge: 2016-03-17 | Disposition: A | Discharge status: Home / Self Care | Payer: Medicare HMO | Source: Ambulatory Visit | Attending: Orthopedic Surgery | Admitting: Orthopedic Surgery

## 2016-03-17 DIAGNOSIS — M1612 Unilateral primary osteoarthritis, left hip: Secondary | ICD-10-CM | POA: Diagnosis present

## 2016-03-17 DIAGNOSIS — Z0183 Encounter for blood typing: Secondary | ICD-10-CM | POA: Diagnosis not present

## 2016-03-17 DIAGNOSIS — M879 Osteonecrosis, unspecified: Secondary | ICD-10-CM | POA: Insufficient documentation

## 2016-03-17 DIAGNOSIS — Z01812 Encounter for preprocedural laboratory examination: Secondary | ICD-10-CM | POA: Diagnosis not present

## 2016-03-17 LAB — CBC WITH DIFFERENTIAL/PLATELET
BASOS ABS: 0 10*3/uL (ref 0.0–0.1)
Basophils Relative: 0 %
Eosinophils Absolute: 0.5 10*3/uL (ref 0.0–0.7)
Eosinophils Relative: 9 %
HCT: 47.9 % (ref 39.0–52.0)
Hemoglobin: 15.9 g/dL (ref 13.0–17.0)
LYMPHS ABS: 1.6 10*3/uL (ref 0.7–4.0)
LYMPHS PCT: 27 %
MCH: 29.1 pg (ref 26.0–34.0)
MCHC: 33.2 g/dL (ref 30.0–36.0)
MCV: 87.7 fL (ref 78.0–100.0)
MONO ABS: 0.5 10*3/uL (ref 0.1–1.0)
MONOS PCT: 9 %
Neutro Abs: 3.3 10*3/uL (ref 1.7–7.7)
Neutrophils Relative %: 55 %
PLATELETS: 217 10*3/uL (ref 150–400)
RBC: 5.46 MIL/uL (ref 4.22–5.81)
RDW: 13.9 % (ref 11.5–15.5)
WBC: 5.9 10*3/uL (ref 4.0–10.5)

## 2016-03-17 LAB — COMPREHENSIVE METABOLIC PANEL
ALT: 16 U/L — ABNORMAL LOW (ref 17–63)
AST: 19 U/L (ref 15–41)
Albumin: 3.8 g/dL (ref 3.5–5.0)
Alkaline Phosphatase: 84 U/L (ref 38–126)
Anion gap: 9 (ref 5–15)
BILIRUBIN TOTAL: 0.8 mg/dL (ref 0.3–1.2)
BUN: 9 mg/dL (ref 6–20)
CO2: 26 mmol/L (ref 22–32)
Calcium: 9.4 mg/dL (ref 8.9–10.3)
Chloride: 103 mmol/L (ref 101–111)
Creatinine, Ser: 0.88 mg/dL (ref 0.61–1.24)
Glucose, Bld: 84 mg/dL (ref 65–99)
POTASSIUM: 3.7 mmol/L (ref 3.5–5.1)
Sodium: 138 mmol/L (ref 135–145)
TOTAL PROTEIN: 7.1 g/dL (ref 6.5–8.1)

## 2016-03-17 LAB — URINALYSIS, ROUTINE W REFLEX MICROSCOPIC
BILIRUBIN URINE: NEGATIVE
GLUCOSE, UA: NEGATIVE mg/dL
Hgb urine dipstick: NEGATIVE
KETONES UR: NEGATIVE mg/dL
LEUKOCYTES UA: NEGATIVE
Nitrite: NEGATIVE
PH: 6 (ref 5.0–8.0)
Protein, ur: NEGATIVE mg/dL
SPECIFIC GRAVITY, URINE: 1.021 (ref 1.005–1.030)

## 2016-03-17 LAB — TYPE AND SCREEN
ABO/RH(D): AB POS
ANTIBODY SCREEN: NEGATIVE

## 2016-03-17 LAB — SURGICAL PCR SCREEN
MRSA, PCR: NEGATIVE
STAPHYLOCOCCUS AUREUS: NEGATIVE

## 2016-03-17 LAB — PROTIME-INR
INR: 1.19 (ref 0.00–1.49)
PROTHROMBIN TIME: 15.3 s — AB (ref 11.6–15.2)

## 2016-03-17 LAB — APTT: APTT: 29 s (ref 24–37)

## 2016-03-17 NOTE — H&P (Signed)
PREOPERATIVE H&P Patient ID: Scott Cordova MRN: [id number] DOB/AGE: 04/16/1956 60 y.o.  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 Randolm IdolFletke.  HPI: Scott Cordova is a 60 y.o. 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 The patient has a history of pain and functional disability in the left hip due to arthritis and has failed non-surgical conservative treatments for greater than 12 weeks to include NSAID's and/or analgesics, corticosteriod injections, supervised PT with diminished ADL's post treatment, use of assistive devices and activity modification.  Onset of symptoms was gradual, starting 2 years ago with rapidlly worsening course since that time. The patient noted no past surgery on the left hip.  Patient currently rates pain at 8 out of 10 with activity. Patient has night pain, worsening of pain with activity and weight bearing, pain that interferes with activities of daily living, pain with passive range of motion and crepitus.  Patient has evidence of severe AVN collapse and deformity of the left hip by imaging studies.  There is no active infection.  Past Medical History  Diagnosis Date  . Asthma   . COPD (chronic obstructive pulmonary disease) (HCC)   . Collar bone fracture   . Broken ribs   . Hypertension   . Sleep apnea 2014    [facility] [place] told that he didn't need it   . Shortness of breath dyspnea   . Arthritis     OA - both hips, hands  . Hepatitis C 08/13/14    patient reported   Past Surgical History  Procedure Laterality Date  . Tonsillectomy    . Hernia repair Right     as a child- on R side- inguinal   . Total hip arthroplasty Right 12/24/2015    Procedure: RIGHT TOTAL HIP ARTHROPLASTY ANTERIOR APPROACH;  Surgeon: Sheral Apleyimothy D Murphy, MD;  Location: [facility];  Service: Orthopedics;   Laterality: Right;  . Colonoscopy w/ polypectomy     Allergies  Allergen Reactions  . Penicillins Other (See Comments)    UNSPECIFIED, mother died from it Has patient had a PCN reaction causing immediate rash, facial/tongue/throat swelling, SOB or lightheadedness with hypotension: UNSPECIFIED Has patient had a PCN reaction causing severe rash involving mucus membranes or skin necrosis: No Has patient had a PCN reaction that required hospitalization No Has patient had a PCN reaction occurring within the last 10 years: No   Prior to Admission medications   Medication Sig Start Date End Date Taking? Authorizing Provider  albuterol (PROVENTIL HFA;VENTOLIN HFA) 108 (90 BASE) MCG/ACT inhaler Inhale 2 puffs into the lungs every 4 (four) hours as needed. Shortness of breath 08/27/15   Uvaldo RisingKyle J Fletke, MD  amLODipine (NORVASC) 5 MG tablet Take 1 tablet (5 mg total) by mouth daily. Patient taking differently: Take 5 mg by mouth at bedtime.  08/16/15   Uvaldo RisingKyle J Fletke, MD  aspirin EC 325 MG tablet Take 1 tablet (325 mg total) by mouth daily. 12/24/15   Sheral Apleyimothy D Murphy, MD  atorvastatin (LIPITOR) 40 MG tablet Take 1 tablet (40 mg total) by mouth daily. Patient taking differently: Take 40 mg by mouth daily after breakfast.  11/12/15   Uvaldo RisingKyle J Fletke, MD  COMBIVENT RESPIMAT 20-100 MCG/ACT AERS respimat INHALE ONE PUFF BY MOUTH INTO THE LUNGS EVERY 6 HOURS AS NEEDED FOR WHEEZING 02/07/16   Uvaldo RisingKyle J Fletke, MD  Cyanocobalamin (VITAMIN  B 12 PO) Take 2,500 mcg by mouth daily.     Historical Provider, MD  diclofenac (VOLTAREN) 75 MG EC tablet Take 1 tablet (75 mg total) by mouth 2 (two) times daily. 08/16/15   Uvaldo Rising, MD  docusate sodium (COLACE) 100 MG capsule Take 1 capsule (100 mg total) by mouth 2 (two) times daily. Continue this while taking narcotics to help with bowel movements 12/24/15   Sheral Apley, MD  fluticasone-salmeterol (ADVAIR HFA) [phone] MCG/ACT inhaler Inhale 2 puffs into the lungs 2 (two)  times daily. 09/13/15   Uvaldo Rising, MD  HYDROcodone-acetaminophen (NORCO) 5-325 MG tablet Take 1-2 tablets by mouth every 4 (four) hours as needed for moderate pain. 12/24/15   Sheral Apley, MD  INCRUSE ELLIPTA 62.5 MCG/INH AEPB Inhale 1 puff into the lungs daily. 12/13/15   Uvaldo Rising, MD  magnesium gluconate (MAGONATE) 500 MG tablet Take 500 mg by mouth every other day. Alternates MVT with other supplements.    Historical Provider, MD  methocarbamol (ROBAXIN) 500 MG tablet Take 1 tablet (500 mg total) by mouth every 6 (six) hours as needed. 12/24/15   Sheral Apley, MD  Multiple Vitamins-Minerals (CENTRUM SILVER ADULT 50+ PO) Take 1 tablet by mouth over 48 hr.     Historical Provider, MD  ondansetron (ZOFRAN) 4 MG tablet Take 1 tablet (4 mg total) by mouth every 8 (eight) hours as needed for nausea. 12/24/15   Sheral Apley, MD  Potassium Gluconate 595 MG TBCR Take 1 tablet by mouth daily.     Historical Provider, MD  Vitamin D, Cholecalciferol, 1000 UNITS TABS Take 2,000 Units by mouth every morning.    Historical Provider, MD  vitamin E 100 UNIT capsule Take 400 Units by mouth daily.    Historical Provider, MD   Social History   Single, smoker: previously 1 1/4 ppd x20 years.  Still smokes occasionally.  Drinks alcohol intermittently - about 12 beers over a weekend.  Previous drug addiction - clean since 2000.  Lives with his daughter, her husband and their two children.   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 Alcohol abuse Father   . Hyperlipidemia Sister   . Hypertension Sister   . Alcohol abuse Sister   . Drug abuse Sister   . Diabetes Maternal Grandmother     ROS: Currently denies lightheadedness, dizziness, Fever, chills, CP.  Chronic SOB w/ exertion dt  COPD.   No personal history of DVT, PE, MI, or CVA. No loose teeth or dentures All other systems have been reviewed and were otherwise negative with the exception of those mentioned in the HPI and as above.  Objective: Vitals: Ht: 5'4" Wt: 185 Temp: 97.9 BP: 148/98 Pulse: 90 O2 96% on room air. Physical Exam: General: Alert, NAD. Trendelenberg Gait walks with walker. HEENT: EOMI, Good Neck Extension. Pulm: No increased work of breathing.  Clear B/L A/P w/o crackle or wheeze.   CV: RRR, No m/g/r appreciated  GI: soft, NT, ND Neuro: Neuro grossly intact b/l upper/lower ext.  Sensation intact distally Skin: No lesions in the area of chief complaint MSK/Surgical Site: Non tender over greater trochanter.  Pain with passive ROM.  Positive Stinchfield.  5/5 strength.  NVI.  Sensation  intact distally.  Imaging Review Plain radiographs demonstrate severe collapse with a superior acetabular cyst on the left side.    Assessment: OSTEOARTHRITIS AVASCULAR NECROSIS LEFT HIP  Plan: Plan for Procedure(s): TOTAL LEFT HIP ARTHROPLASTY ANTERIOR APPROACH  The patient history, physical examination, clinical judgement of the provider and imaging studies are consistent with end stage degenerative joint disease and hip joint arthroplasty is deemed medically necessary. The treatment options including medical management, injection therapy, arthroscopy and arthroplasty were discussed at length. The risks and benefits of Procedure(s): TOTAL LEFT HIP ARTHROPLASTY ANTERIOR APPROACH were presented and reviewed.  The risks of nonoperative treatment, versus surgical intervention including but not limited to continued pain, aseptic loosening, stiffness, dislocation/subluxation, infection, bleeding, nerve injury, blood clots, cardiopulmonary complications, morbidity, mortality, among others were discussed. The patient verbalizes understanding and wishes to proceed with the plan.  Patient is being admitted for inpatient  treatment for surgery, pain control, PT, OT, prophylactic antibiotics, VTE prophylaxis, progressive ambulation and ADL's and discharge planning.   Dental prophylaxis discussed and recommended for 2 years postoperatively.   The patient does meet the criteria for TXA which will be used perioperatively via IV.    ASA 325 mg will be used postoperatively for DVT prophylaxis in addition to SCDs, and early ambulation.  The patient is planning to be discharged home with [facility] His daughter.  Lucretia Kern Martensen III,PA-C 03/17/2016 4:48 PM

## 2016-03-17 NOTE — Pre-Procedure Instructions (Signed)
    Scott Cordova  03/17/2016    Your procedure is scheduled on Tuesday, June 20.  [facility]20 [facility].M.                Your surgery or procedure is scheduled for 10:20 AM       Call this number if you have problems the morning of surgery:[phone]   Remember:  Do not eat food or drink liquids after midnight.  Take these medicines the morning of surgery with A SIP OF WATER: Amlodipine, Atorvastatin.   Do not wear jewelry, make-up or nail polish.  Do not wear lotions, powders, or perfumes.     Men may shave face and neck.  Do not bring valuables to the hospital.  [facility] is not responsible for any belongings or valuables.  Contacts, dentures or bridgework may not be worn into surgery.  Leave your suitcase in the car.  After surgery it may be brought to your room.  For patients admitted to the hospital, discharge time will be determined by your treatment team.  Special instructions:  Review  [place] - Preparing For Surgery.  Please read over the following fact sheets that you were given.  [place]- Preparing For Surgery and Patient Instructions for Mupirocin Application

## 2016-03-17 NOTE — Progress Notes (Signed)
   03/17/16 1506  OBSTRUCTIVE SLEEP APNEA  Have you ever been diagnosed with sleep apnea through a sleep study? No  Do you snore loudly (loud enough to be heard through closed doors)?  0  Do you often feel tired, fatigued, or sleepy during the daytime (such as falling asleep during driving or talking to someone)? 0  Has anyone observed you stop breathing during your sleep? 1  Do you have, or are you being treated for high blood pressure? 1  BMI more than 35 kg/m2? 0  Age > 50 (1-yes) 1  Neck circumference greater than:Male 16 inches or larger, Male 17inches or larger? 1 66(17)  Male Gender (Yes=1) 1  Obstructive Sleep Apnea Score 5  Score 5 or greater  Results sent to PCP

## 2016-03-20 ENCOUNTER — Other Ambulatory Visit (HOSPITAL_COMMUNITY): Payer: Medicare HMO

## 2016-03-30 MED ORDER — DEXTROSE-NACL 5-0.45 % IV SOLN: 100.0000 mL/h | INTRAVENOUS | Status: DC

## 2016-03-30 MED ORDER — VANCOMYCIN HCL IN DEXTROSE 1-5 GM/200ML-% IV SOLN
1000.0000 mg | INTRAVENOUS | Status: AC
  Administered 2016-03-31: 1000 mg via INTRAVENOUS
  Filled 2016-03-30 (×2): qty 200

## 2016-03-30 MED ORDER — ACETAMINOPHEN 500 MG PO TABS
1000.0000 mg | ORAL_TABLET | Freq: Once | ORAL | Status: AC
  Administered 2016-03-31: 1000 mg via ORAL
  Filled 2016-03-30: qty 2

## 2016-03-30 MED ORDER — TRANEXAMIC ACID 1000 MG/10ML IV SOLN
1000.0000 mg | INTRAVENOUS | Status: AC
  Administered 2016-03-31: 1000 mg via INTRAVENOUS
  Filled 2016-03-30: qty 10

## 2016-03-31 ENCOUNTER — Inpatient Hospital Stay (HOSPITAL_COMMUNITY): Payer: Medicare HMO | Admitting: Anesthesiology

## 2016-03-31 ENCOUNTER — Encounter (HOSPITAL_COMMUNITY)
Admission: RE | Disposition: A | Discharge status: Home Health | Payer: Self-pay | Source: Ambulatory Visit | Attending: Orthopedic Surgery

## 2016-03-31 ENCOUNTER — Inpatient Hospital Stay (HOSPITAL_COMMUNITY): Payer: Medicare HMO

## 2016-03-31 ENCOUNTER — Inpatient Hospital Stay (HOSPITAL_COMMUNITY)
Admission: RE | Admit: 2016-03-31 | Discharge: 2016-04-01 | DRG: 470 | Disposition: A | Discharge status: Home Health | Payer: Medicare HMO | Source: Ambulatory Visit | Attending: Orthopedic Surgery | Admitting: Orthopedic Surgery

## 2016-03-31 ENCOUNTER — Encounter (HOSPITAL_COMMUNITY): Payer: Self-pay | Admitting: Anesthesiology

## 2016-03-31 DIAGNOSIS — M19042 Primary osteoarthritis, left hand: Secondary | ICD-10-CM | POA: Diagnosis present

## 2016-03-31 DIAGNOSIS — G4730 Sleep apnea, unspecified: Secondary | ICD-10-CM | POA: Diagnosis present

## 2016-03-31 DIAGNOSIS — E785 Hyperlipidemia, unspecified: Secondary | ICD-10-CM | POA: Diagnosis present

## 2016-03-31 DIAGNOSIS — Z8249 Family history of ischemic heart disease and other diseases of the circulatory system: Secondary | ICD-10-CM | POA: Diagnosis not present

## 2016-03-31 DIAGNOSIS — M8788 Other osteonecrosis, other site: Secondary | ICD-10-CM | POA: Diagnosis present

## 2016-03-31 DIAGNOSIS — B1920 Unspecified viral hepatitis C without hepatic coma: Secondary | ICD-10-CM | POA: Diagnosis present

## 2016-03-31 DIAGNOSIS — F17210 Nicotine dependence, cigarettes, uncomplicated: Secondary | ICD-10-CM | POA: Diagnosis present

## 2016-03-31 DIAGNOSIS — J449 Chronic obstructive pulmonary disease, unspecified: Secondary | ICD-10-CM | POA: Diagnosis present

## 2016-03-31 DIAGNOSIS — M19041 Primary osteoarthritis, right hand: Secondary | ICD-10-CM | POA: Diagnosis present

## 2016-03-31 DIAGNOSIS — M1612 Unilateral primary osteoarthritis, left hip: Principal | ICD-10-CM | POA: Diagnosis present

## 2016-03-31 DIAGNOSIS — M160 Bilateral primary osteoarthritis of hip: Secondary | ICD-10-CM | POA: Diagnosis present

## 2016-03-31 DIAGNOSIS — E7800 Pure hypercholesterolemia, unspecified: Secondary | ICD-10-CM | POA: Diagnosis present

## 2016-03-31 DIAGNOSIS — Z419 Encounter for procedure for purposes other than remedying health state, unspecified: Secondary | ICD-10-CM

## 2016-03-31 DIAGNOSIS — Z79899 Other long term (current) drug therapy: Secondary | ICD-10-CM

## 2016-03-31 DIAGNOSIS — I10 Essential (primary) hypertension: Secondary | ICD-10-CM | POA: Diagnosis present

## 2016-03-31 DIAGNOSIS — Z880 Allergy status to penicillin: Secondary | ICD-10-CM

## 2016-03-31 DIAGNOSIS — K219 Gastro-esophageal reflux disease without esophagitis: Secondary | ICD-10-CM | POA: Diagnosis present

## 2016-03-31 DIAGNOSIS — M8700 Idiopathic aseptic necrosis of unspecified bone: Secondary | ICD-10-CM | POA: Diagnosis present

## 2016-03-31 HISTORY — DX: Pure hypercholesterolemia, unspecified: E78.00

## 2016-03-31 HISTORY — DX: Anxiety disorder, unspecified: F41.9

## 2016-03-31 HISTORY — DX: Unspecified osteoarthritis, unspecified site: M19.90

## 2016-03-31 HISTORY — PX: TOTAL HIP ARTHROPLASTY: SHX124

## 2016-03-31 SURGERY — ARTHROPLASTY, HIP, TOTAL, ANTERIOR APPROACH
Anesthesia: Monitor Anesthesia Care | Site: Hip | Laterality: Left

## 2016-03-31 MED ORDER — ONDANSETRON HCL 4 MG/2ML IJ SOLN
INTRAMUSCULAR | Status: AC
  Filled 2016-03-31: qty 2

## 2016-03-31 MED ORDER — PROMETHAZINE HCL 25 MG/ML IJ SOLN: 6.2500 mg | INTRAMUSCULAR | Status: DC | PRN

## 2016-03-31 MED ORDER — METOCLOPRAMIDE HCL 5 MG PO TABS: 5.0000 mg | ORAL_TABLET | Freq: Three times a day (TID) | ORAL | Status: DC | PRN

## 2016-03-31 MED ORDER — ONDANSETRON HCL 4 MG/2ML IJ SOLN
INTRAMUSCULAR | Status: DC | PRN
  Administered 2016-03-31: 4 mg via INTRAVENOUS

## 2016-03-31 MED ORDER — PHENYLEPHRINE HCL 10 MG/ML IJ SOLN
INTRAMUSCULAR | Status: DC | PRN
  Administered 2016-03-31 (×4): 80 ug via INTRAVENOUS

## 2016-03-31 MED ORDER — OXYCODONE HCL 5 MG PO TABS
ORAL_TABLET | ORAL | Status: AC
  Filled 2016-03-31: qty 1

## 2016-03-31 MED ORDER — METHOCARBAMOL 500 MG PO TABS
500.0000 mg | ORAL_TABLET | Freq: Four times a day (QID) | ORAL | Status: DC | PRN
  Administered 2016-03-31 – 2016-04-01 (×2): 500 mg via ORAL
  Filled 2016-03-31 (×2): qty 1

## 2016-03-31 MED ORDER — FENTANYL CITRATE (PF) 100 MCG/2ML IJ SOLN
INTRAMUSCULAR | Status: DC | PRN
  Administered 2016-03-31: 50 ug via INTRAVENOUS

## 2016-03-31 MED ORDER — PHENYLEPHRINE HCL 10 MG/ML IJ SOLN
10.0000 mg | INTRAVENOUS | Status: DC | PRN
  Administered 2016-03-31: 50 ug/min via INTRAVENOUS

## 2016-03-31 MED ORDER — BUPIVACAINE-EPINEPHRINE (PF) 0.25% -1:200000 IJ SOLN
INTRAMUSCULAR | Status: AC
  Filled 2016-03-31: qty 30

## 2016-03-31 MED ORDER — ACETAMINOPHEN 650 MG RE SUPP: 650.0000 mg | Freq: Four times a day (QID) | RECTAL | Status: DC | PRN

## 2016-03-31 MED ORDER — DEXTROSE-NACL 5-0.45 % IV SOLN
INTRAVENOUS | Status: DC
  Administered 2016-04-01: 06:00:00 via INTRAVENOUS

## 2016-03-31 MED ORDER — 0.9 % SODIUM CHLORIDE (POUR BTL) OPTIME
TOPICAL | Status: DC | PRN
  Administered 2016-03-31: 1000 mL

## 2016-03-31 MED ORDER — MORPHINE SULFATE (PF) 2 MG/ML IV SOLN
2.0000 mg | INTRAVENOUS | Status: DC | PRN
  Administered 2016-04-01: 2 mg via INTRAVENOUS
  Filled 2016-03-31: qty 1

## 2016-03-31 MED ORDER — ASPIRIN EC 325 MG PO TBEC
325.0000 mg | DELAYED_RELEASE_TABLET | Freq: Every day | ORAL | Status: DC
Start: 2016-04-01 — End: 2016-04-01
  Administered 2016-04-01: 325 mg via ORAL
  Filled 2016-03-31: qty 1

## 2016-03-31 MED ORDER — LACTATED RINGERS IV SOLN
INTRAVENOUS | Status: DC
  Administered 2016-03-31 (×2): via INTRAVENOUS

## 2016-03-31 MED ORDER — KETOROLAC TROMETHAMINE 30 MG/ML IJ SOLN
INTRAMUSCULAR | Status: DC | PRN
  Administered 2016-03-31: 30 mg via INTRA_ARTICULAR

## 2016-03-31 MED ORDER — UMECLIDINIUM BROMIDE 62.5 MCG/INH IN AEPB
1.0000 | INHALATION_SPRAY | Freq: Every day | RESPIRATORY_TRACT | Status: DC
  Administered 2016-04-01: 1 via RESPIRATORY_TRACT
  Filled 2016-03-31: qty 7

## 2016-03-31 MED ORDER — PROPOFOL 500 MG/50ML IV EMUL
INTRAVENOUS | Status: DC | PRN
  Administered 2016-03-31: 75 ug/kg/min via INTRAVENOUS

## 2016-03-31 MED ORDER — IPRATROPIUM-ALBUTEROL 0.5-2.5 (3) MG/3ML IN SOLN: 3.0000 mL | Freq: Four times a day (QID) | RESPIRATORY_TRACT | Status: DC | PRN

## 2016-03-31 MED ORDER — FENTANYL CITRATE (PF) 250 MCG/5ML IJ SOLN
INTRAMUSCULAR | Status: AC
  Filled 2016-03-31: qty 5

## 2016-03-31 MED ORDER — DEXAMETHASONE SODIUM PHOSPHATE 10 MG/ML IJ SOLN
10.0000 mg | Freq: Once | INTRAMUSCULAR | Status: AC
  Administered 2016-04-01: 10 mg via INTRAVENOUS
  Filled 2016-03-31: qty 1

## 2016-03-31 MED ORDER — OXYCODONE HCL 5 MG/5ML PO SOLN: 5.0000 mg | Freq: Once | ORAL | Status: AC | PRN

## 2016-03-31 MED ORDER — ACETAMINOPHEN 325 MG PO TABS: 650.0000 mg | ORAL_TABLET | Freq: Four times a day (QID) | ORAL | Status: DC | PRN

## 2016-03-31 MED ORDER — ONDANSETRON HCL 4 MG PO TABS: 4.0000 mg | ORAL_TABLET | Freq: Four times a day (QID) | ORAL | Status: DC | PRN

## 2016-03-31 MED ORDER — MENTHOL 3 MG MT LOZG: 1.0000 | LOZENGE | OROMUCOSAL | Status: DC | PRN

## 2016-03-31 MED ORDER — OXYCODONE HCL 5 MG PO TABS
5.0000 mg | ORAL_TABLET | Freq: Once | ORAL | Status: AC | PRN
  Administered 2016-03-31: 5 mg via ORAL

## 2016-03-31 MED ORDER — ATORVASTATIN CALCIUM 40 MG PO TABS
40.0000 mg | ORAL_TABLET | Freq: Every day | ORAL | Status: DC
  Administered 2016-04-01: 40 mg via ORAL
  Filled 2016-03-31: qty 1

## 2016-03-31 MED ORDER — MIDAZOLAM HCL 5 MG/5ML IJ SOLN
INTRAMUSCULAR | Status: DC | PRN
  Administered 2016-03-31: 2 mg via INTRAVENOUS

## 2016-03-31 MED ORDER — IPRATROPIUM-ALBUTEROL 20-100 MCG/ACT IN AERS: 1.0000 | INHALATION_SPRAY | Freq: Four times a day (QID) | RESPIRATORY_TRACT | Status: DC | PRN

## 2016-03-31 MED ORDER — MIDAZOLAM HCL 2 MG/2ML IJ SOLN
INTRAMUSCULAR | Status: AC
  Filled 2016-03-31: qty 2

## 2016-03-31 MED ORDER — AMLODIPINE BESYLATE 5 MG PO TABS: 5.0000 mg | ORAL_TABLET | Freq: Every day | ORAL | Status: DC

## 2016-03-31 MED ORDER — DIPHENHYDRAMINE HCL 12.5 MG/5ML PO ELIX
12.5000 mg | ORAL_SOLUTION | ORAL | Status: DC | PRN
Start: 2016-03-31 — End: 2016-04-01

## 2016-03-31 MED ORDER — ONDANSETRON HCL 4 MG PO TABS: 4.0000 mg | ORAL_TABLET | Freq: Three times a day (TID) | ORAL | Status: AC | PRN

## 2016-03-31 MED ORDER — MOMETASONE FURO-FORMOTEROL FUM 200-5 MCG/ACT IN AERO
2.0000 | INHALATION_SPRAY | Freq: Two times a day (BID) | RESPIRATORY_TRACT | Status: DC
  Administered 2016-03-31 – 2016-04-01 (×2): 2 via RESPIRATORY_TRACT
  Filled 2016-03-31: qty 8.8

## 2016-03-31 MED ORDER — ONDANSETRON HCL 4 MG/2ML IJ SOLN: 4.0000 mg | Freq: Four times a day (QID) | INTRAMUSCULAR | Status: DC | PRN

## 2016-03-31 MED ORDER — DOCUSATE SODIUM 100 MG PO CAPS
100.0000 mg | ORAL_CAPSULE | Freq: Two times a day (BID) | ORAL | Status: DC
  Administered 2016-03-31 – 2016-04-01 (×2): 100 mg via ORAL
  Filled 2016-03-31 (×2): qty 1

## 2016-03-31 MED ORDER — KETOROLAC TROMETHAMINE 15 MG/ML IJ SOLN
15.0000 mg | Freq: Four times a day (QID) | INTRAMUSCULAR | Status: DC
  Administered 2016-03-31 – 2016-04-01 (×3): 15 mg via INTRAVENOUS
  Filled 2016-03-31 (×3): qty 1

## 2016-03-31 MED ORDER — HYDROMORPHONE HCL 1 MG/ML IJ SOLN
INTRAMUSCULAR | Status: AC
  Filled 2016-03-31: qty 1

## 2016-03-31 MED ORDER — OXYCODONE HCL 5 MG PO TABS
5.0000 mg | ORAL_TABLET | ORAL | Status: DC | PRN
  Administered 2016-03-31 – 2016-04-01 (×4): 10 mg via ORAL
  Filled 2016-03-31 (×4): qty 2

## 2016-03-31 MED ORDER — BUPIVACAINE HCL (PF) 0.5 % IJ SOLN
INTRAMUSCULAR | Status: DC | PRN
  Administered 2016-03-31: 3 mL via INTRATHECAL

## 2016-03-31 MED ORDER — SODIUM CHLORIDE FLUSH 0.9 % IV SOLN
INTRAVENOUS | Status: DC | PRN
  Administered 2016-03-31: 30 mL

## 2016-03-31 MED ORDER — METHOCARBAMOL 1000 MG/10ML IJ SOLN
500.0000 mg | Freq: Four times a day (QID) | INTRAVENOUS | Status: DC | PRN
  Filled 2016-03-31: qty 5

## 2016-03-31 MED ORDER — BUPIVACAINE-EPINEPHRINE 0.25% -1:200000 IJ SOLN
INTRAMUSCULAR | Status: DC | PRN
  Administered 2016-03-31: 30 mL

## 2016-03-31 MED ORDER — ASPIRIN EC 325 MG PO TBEC: 325.0000 mg | DELAYED_RELEASE_TABLET | Freq: Every day | ORAL | Status: AC

## 2016-03-31 MED ORDER — PHENOL 1.4 % MT LIQD: 1.0000 | OROMUCOSAL | Status: DC | PRN

## 2016-03-31 MED ORDER — KETOROLAC TROMETHAMINE 30 MG/ML IJ SOLN
INTRAMUSCULAR | Status: AC
  Filled 2016-03-31: qty 1

## 2016-03-31 MED ORDER — PHENYLEPHRINE 40 MCG/ML (10ML) SYRINGE FOR IV PUSH (FOR BLOOD PRESSURE SUPPORT)
PREFILLED_SYRINGE | INTRAVENOUS | Status: AC
  Filled 2016-03-31: qty 10

## 2016-03-31 MED ORDER — CELECOXIB 200 MG PO CAPS
200.0000 mg | ORAL_CAPSULE | Freq: Two times a day (BID) | ORAL | Status: DC
Start: 2016-03-31 — End: 2016-04-01
  Administered 2016-03-31 – 2016-04-01 (×2): 200 mg via ORAL
  Filled 2016-03-31 (×2): qty 1

## 2016-03-31 MED ORDER — CHLORHEXIDINE GLUCONATE 4 % EX LIQD: 60.0000 mL | Freq: Once | CUTANEOUS | Status: DC

## 2016-03-31 MED ORDER — HYDROMORPHONE HCL 1 MG/ML IJ SOLN
0.2500 mg | INTRAMUSCULAR | Status: DC | PRN
  Administered 2016-03-31 (×2): 0.5 mg via INTRAVENOUS

## 2016-03-31 MED ORDER — OXYCODONE-ACETAMINOPHEN 5-325 MG PO TABS: 1.0000 | ORAL_TABLET | ORAL | Status: AC | PRN

## 2016-03-31 MED ORDER — VANCOMYCIN HCL IN DEXTROSE 1-5 GM/200ML-% IV SOLN
1000.0000 mg | Freq: Two times a day (BID) | INTRAVENOUS | Status: AC
  Administered 2016-03-31: 1000 mg via INTRAVENOUS
  Filled 2016-03-31: qty 200

## 2016-03-31 MED ORDER — METOCLOPRAMIDE HCL 5 MG/ML IJ SOLN: 5.0000 mg | Freq: Three times a day (TID) | INTRAMUSCULAR | Status: DC | PRN

## 2016-03-31 SURGICAL SUPPLY — 57 items
BAG DECANTER FOR FLEXI CONT (MISCELLANEOUS) IMPLANT
BLADE SAG 18X100X1.27 (BLADE) IMPLANT
BLADE SAW SGTL 18X1.27X75 (BLADE) IMPLANT
BLADE SAW SGTL 18X1.27X75MM (BLADE)
BLADE SURG ROTATE 9660 (MISCELLANEOUS) IMPLANT
CAPT HIP TOTAL 2 ×3 IMPLANT
CLOSURE STERI-STRIP 1/2X4 (GAUZE/BANDAGES/DRESSINGS) ×1
CLSR STERI-STRIP ANTIMIC 1/2X4 (GAUZE/BANDAGES/DRESSINGS) ×2 IMPLANT
COVER PERINEAL POST (MISCELLANEOUS) ×3 IMPLANT
COVER SURGICAL LIGHT HANDLE (MISCELLANEOUS) ×3 IMPLANT
DECANTER SPIKE VIAL GLASS SM (MISCELLANEOUS) ×3 IMPLANT
DRAPE C-ARM 42X72 X-RAY (DRAPES) IMPLANT
DRAPE STERI IOBAN 125X83 (DRAPES) ×3 IMPLANT
DRAPE U-SHAPE 47X51 STRL (DRAPES) ×6 IMPLANT
DRSG MEPILEX BORDER 4X8 (GAUZE/BANDAGES/DRESSINGS) ×3 IMPLANT
DURAPREP 26ML APPLICATOR (WOUND CARE) ×3 IMPLANT
ELECT BLADE 4.0 EZ CLEAN MEGAD (MISCELLANEOUS) ×3
ELECT REM PT RETURN 9FT ADLT (ELECTROSURGICAL) ×3
ELECTRODE BLDE 4.0 EZ CLN MEGD (MISCELLANEOUS) ×1 IMPLANT
ELECTRODE REM PT RTRN 9FT ADLT (ELECTROSURGICAL) ×1 IMPLANT
FACESHIELD WRAPAROUND (MASK) ×6 IMPLANT
GLOVE BIO SURGEON STRL SZ7 (GLOVE) ×3 IMPLANT
GLOVE BIO SURGEON STRL SZ7.5 (GLOVE) ×3 IMPLANT
GLOVE BIOGEL PI IND STRL 7.0 (GLOVE) ×1 IMPLANT
GLOVE BIOGEL PI IND STRL 8 (GLOVE) ×1 IMPLANT
GLOVE BIOGEL PI INDICATOR 7.0 (GLOVE) ×2
GLOVE BIOGEL PI INDICATOR 8 (GLOVE) ×2
GOWN STRL REUS W/ TWL LRG LVL3 (GOWN DISPOSABLE) ×2 IMPLANT
GOWN STRL REUS W/ TWL XL LVL3 (GOWN DISPOSABLE) ×1 IMPLANT
GOWN STRL REUS W/TWL LRG LVL3 (GOWN DISPOSABLE) ×4
GOWN STRL REUS W/TWL XL LVL3 (GOWN DISPOSABLE) ×2
KIT BASIN OR (CUSTOM PROCEDURE TRAY) ×3 IMPLANT
KIT ROOM TURNOVER OR (KITS) ×3 IMPLANT
MANIFOLD NEPTUNE II (INSTRUMENTS) ×3 IMPLANT
NDL SAFETY ECLIPSE 18X1.5 (NEEDLE) ×2 IMPLANT
NEEDLE 18GX1X1/2 (RX/OR ONLY) (NEEDLE) IMPLANT
NEEDLE HYPO 18GX1.5 SHARP (NEEDLE) ×4
NS IRRIG 1000ML POUR BTL (IV SOLUTION) ×3 IMPLANT
PACK TOTAL JOINT (CUSTOM PROCEDURE TRAY) ×3 IMPLANT
PACK UNIVERSAL I (CUSTOM PROCEDURE TRAY) ×3 IMPLANT
PAD ARMBOARD 7.5X6 YLW CONV (MISCELLANEOUS) ×3 IMPLANT
SPONGE LAP 18X18 X RAY DECT (DISPOSABLE) IMPLANT
SUT MNCRL AB 4-0 PS2 18 (SUTURE) ×3 IMPLANT
SUT MON AB 2-0 CT1 36 (SUTURE) ×6 IMPLANT
SUT VIC AB 0 CT1 27 (SUTURE) ×2
SUT VIC AB 0 CT1 27XBRD ANBCTR (SUTURE) ×1 IMPLANT
SUT VIC AB 1 CT1 27 (SUTURE) ×2
SUT VIC AB 1 CT1 27XBRD ANBCTR (SUTURE) ×1 IMPLANT
SYR 3ML LL SCALE MARK (SYRINGE) ×3 IMPLANT
SYR 50ML LL SCALE MARK (SYRINGE) ×3 IMPLANT
SYRINGE 20CC LL (MISCELLANEOUS) IMPLANT
TOWEL OR 17X24 6PK STRL BLUE (TOWEL DISPOSABLE) ×3 IMPLANT
TOWEL OR 17X26 10 PK STRL BLUE (TOWEL DISPOSABLE) ×3 IMPLANT
TRAY CATH 16FR W/PLASTIC CATH (SET/KITS/TRAYS/PACK) ×3 IMPLANT
TRAY FOLEY CATH 16FRSI W/METER (SET/KITS/TRAYS/PACK) IMPLANT
WATER STERILE IRR 1000ML POUR (IV SOLUTION) IMPLANT
YANKAUER SUCT BULB TIP NO VENT (SUCTIONS) ×3 IMPLANT

## 2016-03-31 NOTE — Anesthesia Preprocedure Evaluation (Addendum)
Anesthesia Evaluation  Patient identified by MRN, date of birth, ID band Patient awake    Reviewed: Allergy & Precautions, NPO status , Patient's Chart, lab work & pertinent test results  Airway Mallampati: II  TM Distance: >3 FB Neck ROM: Full    Dental no notable dental hx. (+) Partial Lower, Poor Dentition, Missing, Chipped, Dental Advisory Given,    Pulmonary shortness of breath, asthma , sleep apnea , COPD,  COPD inhaler, former smoker,    Pulmonary exam normal breath sounds clear to auscultation       Cardiovascular hypertension, Pt. on medications Normal cardiovascular exam Rhythm:Regular Rate:Normal     Neuro/Psych    GI/Hepatic GERD  ,(+) Hepatitis -, C  Endo/Other    Renal/GU      Musculoskeletal  (+) Arthritis ,   Abdominal (+) + obese,   Peds  Hematology   Anesthesia Other Findings   Reproductive/Obstetrics                            Anesthesia Physical  Anesthesia Plan  ASA: II  Anesthesia Plan: MAC and Spinal   Post-op Pain Management:    Induction: Intravenous  Airway Management Planned: Simple Face Mask  Additional Equipment:   Intra-op Plan:   Post-operative Plan:   Informed Consent: I have reviewed the patients History and Physical, chart, labs and discussed the procedure including the risks, benefits and alternatives for the proposed anesthesia with the patient or authorized representative who has indicated his/her understanding and acceptance.   Dental advisory given  Plan Discussed with: CRNA  Anesthesia Plan Comments:         Anesthesia Quick Evaluation

## 2016-03-31 NOTE — Transfer of Care (Signed)
Immediate Anesthesia Transfer of Care Note  Patient: Scott Cordova  Procedure(s) Performed: Procedure(s): TOTAL LEFT HIP ARTHROPLASTY ANTERIOR APPROACH (Left)  Patient Location: PACU  Anesthesia Type:MAC and Spinal  Level of Consciousness: awake, alert , oriented and patient cooperative  Airway & Oxygen Therapy: Patient Spontanous Breathing  Post-op Assessment: Report given to RN and Post -op Vital signs reviewed and stable  Post vital signs: Reviewed  Last Vitals:  Filed Vitals:   03/31/16 0926  BP: 164/95  Pulse: 84  Temp: 36.4 C  Resp: 20    Last Pain: There were no vitals filed for this visit.       Complications: No apparent anesthesia complications

## 2016-03-31 NOTE — Interval H&P Note (Signed)
History and Physical Interval Note:  03/31/2016 9:36 AM  Scott Cordova  has presented today for surgery, with the diagnosis of OSTEOARTHRITIS AVASCULAR NECROSIS LEFT HIP  The various methods of treatment have been discussed with the patient and family. After consideration of risks, benefits and other options for treatment, the patient has consented to  Procedure(s): TOTAL LEFT HIP ARTHROPLASTY ANTERIOR APPROACH (Left) as a surgical intervention .  The patient's history has been reviewed, patient examined, no change in status, stable for surgery.  I have reviewed the patient's chart and labs.  Questions were answered to the patient's satisfaction.     Lucille Crichlow D

## 2016-03-31 NOTE — Discharge Instructions (Signed)

## 2016-03-31 NOTE — Op Note (Signed)
03/31/2016  12:35 PM  PATIENT:  Scott Cordova   MRN: [id number]  PRE-OPERATIVE DIAGNOSIS:  OSTEOARTHRITIS AVASCULAR NECROSIS LEFT HIP  POST-OPERATIVE DIAGNOSIS:  OSTEOARTHRITIS AVASCULAR NECROSIS LEFT HIP  PROCEDURE:  Procedure(s): TOTAL LEFT HIP ARTHROPLASTY ANTERIOR APPROACH  PREOPERATIVE INDICATIONS:    [name] is an 60 y.o. male who has a diagnosis of Primary osteoarthritis of left hip and elected for surgical management after failing conservative treatment.  The risks benefits and alternatives were discussed with the patient including but not limited to the risks of nonoperative treatment, versus surgical intervention including infection, bleeding, nerve injury, periprosthetic fracture, the need for revision surgery, dislocation, leg length discrepancy, blood clots, cardiopulmonary complications, morbidity, mortality, among others, and they were willing to proceed.     OPERATIVE REPORT     SURGEON:   Courtne Lighty, Ernesta Amble, MD    ASSISTANT:  Roxan Hockey, PA-C, She was present and scrubbed throughout the case, critical for completion in a timely fashion, and for retraction, instrumentation, and closure.     ANESTHESIA:  General    COMPLICATIONS:  None.     COMPONENTS:  [employer] acolade fit femur size 5 with a 36 mm -2.5 head ball and a PSL acetabular shell size 56 with a  polyethylene liner    PROCEDURE IN DETAIL:   The patient was met in the holding area and  identified.  The appropriate hip was identified and marked at the operative site.  The patient was then transported to the OR  and  placed under general anesthesia.  At that point, the patient was  placed in the supine position and  secured to the operating room table and all bony prominences padded. He received pre-operative antibiotics    The operative lower extremity was prepped from the iliac crest to the distal leg.  Sterile draping was performed.  Time out was performed prior to incision.      Skin incision  was made just 2 cm lateral to the ASIS  extending in line with the tensor fascia lata. Electrocautery was used to control all bleeders. I dissected down sharply to the fascia of the tensor fascia lata was confirmed that the muscle fibers beneath were running posteriorly. I then incised the fascia over the superficial tensor fascia lata in line with the incision. The fascia was elevated off the anterior aspect of the muscle the muscle was retracted posteriorly and protected throughout the case. I then used electrocautery to incise the tensor fascia lata fascia control and all bleeders. Immediately visible was the fat over top of the anterior neck and capsule.  I removed the anterior fat from the capsule and elevated the rectus muscle off of the anterior capsule. I then removed a large time of capsule. The retractors were then placed over the anterior acetabulum as well as around the superior and inferior neck.  I then removed a section of the femoral neck and a napkin ring fashion. Then used the power course to remove the femoral head from the acetabulum and thoroughly irrigated the acetabulum. I sized the femoral head.    I then exposed the deep acetabulum, cleared out any tissue including the ligamentum teres.   After adequate visualization, I excised the labrum, and then sequentially reamed.  I placed the trial acetabulum, which seated nicely, and then impacted the real cup into place.  Appropriate version and inclination was confirmed clinically matching their bony anatomy, and also with the use transverse acetabular ligament.  I placed a 45m screw  in the posterior/superio position with an excellent bite.    I then placed the polyethylene liner in place  I then abducted the leg and released the external rotators from the posterior femur allowing it to be easily delivered up lateral and anterior to the acetabulum for preparation of the femoral canal.    I then prepared the proximal femur using the  cookie-cutter and then sequentially reamed and broached.  A trial broach, neck, and head was utilized, and I reduced the hip and it was found to have excellent stability with functional range of motion..  I then impacted the real femoral prosthesis into place into the appropriate version, slightly anteverted to the normal anatomy, and I impacted the real head ball into place. The hip was then reduced and taken through functional range of motion and found to have excellent stability. Leg lengths were restored.  I then irrigated the hip copiously again with, and repaired the fascia with Vicryl, followed by monocryl for the subcutaneous tissue, Monocryl for the skin, Steri-Strips and sterile gauze. The patient was then awakened and returned to PACU in stable and satisfactory condition. There were no complications.  POST OPERATIVE PLAN: WBAT, DVT px: SCD's/TED and ASA 325  [name], MD Orthopedic Surgeon [phone]   This note was generated using a template and dragon dictation system. In light of that, I have reviewed the note and all aspects of it are applicable to this case. Any dictation errors are due to the computerized dictation system.

## 2016-03-31 NOTE — Progress Notes (Signed)
Orthopedic Tech Progress Note Patient Details:  Currie Parisyrone Worthy 03/18/1956 [id number]  Ortho Devices Ortho Device/Splint Location: applied ohf to bed Ortho Device/Splint Interventions: Ordered, Application   Jennye MoccasinHughes, Delvis Kau Craig 03/31/2016, 7:13 PM

## 2016-03-31 NOTE — Anesthesia Procedure Notes (Addendum)
Procedure Name: MAC Date/Time: 03/31/2016 10:55 AM Performed by: Lovie CholOCK, JENNIFER K Pre-anesthesia Checklist: Patient identified, Emergency Drugs available, Suction available, Patient being monitored and Timeout performed Patient Re-evaluated:Patient Re-evaluated prior to inductionOxygen Delivery Method: Simple face mask   Spinal Patient location during procedure: OR Staffing Anesthesiologist: Katlynne Mckercher Performed by: anesthesiologist  Preanesthetic Checklist Completed: patient identified, site marked, surgical consent, pre-op evaluation, timeout performed, IV checked, risks and benefits discussed and monitors and equipment checked Spinal Block Patient position: sitting Prep: ChloraPrep Patient monitoring: heart rate, cardiac monitor, continuous pulse ox and blood pressure Approach: midline Location: L3-4 Injection technique: single-shot Needle Needle type: Quincke  Needle gauge: 22 G Assessment Sensory level: T6 Additional Notes Functioning IV was confirmed and monitors were applied. Sterile prep and drape, including hand hygiene, mask and sterile gloves were used. The patient was positioned and the spine was prepped. The skin was anesthetized with lidocaine.  Free flow of clear CSF was obtained prior to injecting local anesthetic into the CSF.  The spinal needle aspirated freely following injection.  The needle was carefully withdrawn.  The patient tolerated the procedure well. Consent was obtained prior to procedure with all questions answered and concerns addressed.  Blima DessertBen Elizeo Rodriques, MD

## 2016-03-31 NOTE — Anesthesia Postprocedure Evaluation (Signed)
Anesthesia Post Note  Patient: Scott Cordova  Procedure(s) Performed: Procedure(s) (LRB): TOTAL LEFT HIP ARTHROPLASTY ANTERIOR APPROACH (Left)  Patient location during evaluation: PACU Anesthesia Type: Spinal Level of consciousness: oriented and awake and alert Pain management: pain level controlled Vital Signs Assessment: post-procedure vital signs reviewed and stable Respiratory status: spontaneous breathing, respiratory function stable and patient connected to nasal cannula oxygen Cardiovascular status: blood pressure returned to baseline and stable Postop Assessment: no headache and no backache Anesthetic complications: no    Last Vitals:  Filed Vitals:   03/31/16 1430 03/31/16 1445  BP: 131/96 124/77  Pulse: 66 50  Temp:    Resp: 18 14    Last Pain: There were no vitals filed for this visit.               Reino KentJudd, Keiarra Charon J

## 2016-03-31 NOTE — OR Nursing (Signed)
1310: in&out cath=500cc cyu; pas hose placed post-op.

## 2016-03-31 NOTE — Interval H&P Note (Signed)
History and Physical Interval Note:  03/31/2016 7:40 AM  Scott Cordova  has presented today for surgery, with the diagnosis of OSTEOARTHRITIS AVASCULAR NECROSIS LEFT HIP  The various methods of treatment have been discussed with the patient and family. After consideration of risks, benefits and other options for treatment, the patient has consented to  Procedure(s): TOTAL LEFT HIP ARTHROPLASTY ANTERIOR APPROACH (Left) as a surgical intervention .  The patient's history has been reviewed, patient examined, no change in status, stable for surgery.  I have reviewed the patient's chart and labs.  Questions were answered to the patient's satisfaction.     Jasman Murri D

## 2016-04-01 ENCOUNTER — Encounter (HOSPITAL_COMMUNITY): Payer: Self-pay | Admitting: Orthopedic Surgery

## 2016-04-01 LAB — CBC
HEMATOCRIT: 40.2 % (ref 39.0–52.0)
HEMOGLOBIN: 13.1 g/dL (ref 13.0–17.0)
MCH: 28.2 pg (ref 26.0–34.0)
MCHC: 32.6 g/dL (ref 30.0–36.0)
MCV: 86.5 fL (ref 78.0–100.0)
Platelets: 148 10*3/uL — ABNORMAL LOW (ref 150–400)
RBC: 4.65 MIL/uL (ref 4.22–5.81)
RDW: 13.6 % (ref 11.5–15.5)
WBC: 7.7 10*3/uL (ref 4.0–10.5)

## 2016-04-01 MED ORDER — METHOCARBAMOL 500 MG PO TABS: 500.0000 mg | ORAL_TABLET | Freq: Four times a day (QID) | ORAL | Status: AC | PRN

## 2016-04-01 NOTE — Progress Notes (Signed)
Physical Therapy Treatment Patient Details Name: Scott Cordova MRN: [id number] DOB: [birth year] Today's Date: 04/01/2016    History of Present Illness Pt is 60 yo male who presents for left direct anterior THA, had right done 3/17, PMH; arthritis, COPD, hep C, HTN    PT Comments    Pt has progressed well and is ready for d/c home to be followed by HHPT. Completed ambulation goal as well as stairs. D/c recs discussed. Acute PT signing off.   Follow Up Recommendations  Home health PT     Equipment Recommendations  None recommended by PT    Recommendations for Other Services       Precautions / Restrictions Precautions Precautions: None Restrictions Weight Bearing Restrictions: Yes LLE Weight Bearing: Weight bearing as tolerated Other Position/Activity Restrictions: L THA, direct anterior approach    Mobility  Bed Mobility Overal bed mobility: Needs Assistance Bed Mobility: Supine to Sit     Supine to sit: Supervision     General bed mobility comments: pt up in recliner upon arrival  Transfers Overall transfer level: Modified independent Equipment used: Rolling walker (2 wheeled) Transfers: Sit to/from Stand Sit to Stand: Modified independent (Device/Increase time)         General transfer comment: pt stands safely to RW  Ambulation/Gait Ambulation/Gait assistance: Modified independent (Device/Increase time) Ambulation Distance (Feet): 150 Feet Assistive device: Rolling walker (2 wheeled) Gait Pattern/deviations: Step-through pattern;Decreased weight shift to left Gait velocity: decreased Gait velocity interpretation: Below normal speed for age/gender General Gait Details: pt working on toe neutral and erect posture   Stairs Stairs: Yes Stairs assistance: Supervision Stair Management: Two rails;Forwards;Step to pattern Number of Stairs: 5 General stair comments: pt safe with stairs with handrails  Wheelchair Mobility    Modified Rankin (Stroke  Patients Only)       Balance Overall balance assessment: No apparent balance deficits (not formally assessed)                                  Cognition Arousal/Alertness: Lethargic;Suspect due to medications Behavior During Therapy: [facility] for tasks assessed/performed Overall Cognitive Status: Within Functional Limits for tasks assessed                      Exercises Total Joint Exercises Ankle Circles/Pumps: AROM;Both;20 reps;Seated Quad Sets: AROM;Both;20 reps;Seated Gluteal Sets: AROM;10 reps;Seated Hip ABduction/ADduction: AROM;Left;10 reps;Standing Standing Hip Extension: AROM;Left;10 reps;Standing    General Comments General comments (skin integrity, edema, etc.): discussed car transfer and d/c recs      Pertinent Vitals/Pain Pain Assessment: 0-10 Pain Score: 6  Pain Location: left hip with ambulation Pain Descriptors / Indicators: Sore Pain Intervention(s): Limited activity within patient's tolerance;Monitored during session;Premedicated before session    Home Living Family/patient expects to be discharged to:: Private residence Living Arrangements: Children;Other relatives Available Help at Discharge: Family Type of Home: House Home Access: Stairs to enter Entrance Stairs-Rails: Can reach both Home Layout: One level Home Equipment: Cane - single point;Wheelchair - manual;Shower seat - built in;Bedside commode;Walker - 2 wheels      Prior Function Level of Independence: Independent with assistive device(s)      Comments: uses cane and RW   PT Goals (current goals can now be found in the care plan section) Acute Rehab PT Goals Patient Stated Goal: return home, dance with his granddaughter PT Goal Formulation: With patient Time For Goal Achievement: 04/08/16 Potential to Achieve Goals:  Good Progress towards PT goals: Goals met/education completed, patient discharged from PT    Frequency  7X/week    PT Plan Current plan remains  appropriate    Co-evaluation             End of Session Equipment Utilized During Treatment: Gait belt Activity Tolerance: Patient tolerated treatment well Patient left: in chair;with call bell/phone within reach;with family/visitor present     Time: 1202-1224 PT Time Calculation (min) (ACUTE ONLY): 22 min  Charges:  $Gait Training: 8-22 mins                    G Codes:     Leighton Roach, PT  Acute Rehab Services  [phone]  Leighton Roach 04/01/2016, 12:48 PM

## 2016-04-01 NOTE — Care Management Important Message (Signed)
Important Message  Patient Details  Name: Scott Cordova MRN: [id number] Date of Birth: [birth year]   Medicare Important Message Given:  Yes    Bernadette HoitShoffner, Amyrah Pinkhasov Coleman 04/01/2016, 1:52 PM

## 2016-04-01 NOTE — Progress Notes (Signed)
   04/01/16 0900  Clinical Encounter Type  Visited With Patient  Visit Type Spiritual support  Referral From Patient  Spiritual Encounters  Spiritual Needs Literature  Chaplain reviewed AD with patient and answered questions. Will return with notary and witnesses as soon as available. Ladashia Demarinis, Chaplain

## 2016-04-01 NOTE — Evaluation (Signed)
Occupational Therapy Evaluation Patient Details Name: Scott Cordova MRN: [id number] DOB: 1956/05/11 Today's Date: 04/01/2016    History of Present Illness Pt is 60 yo male who presents for left direct anterior THA, had right done 3/17, PMH; arthritis, COPD, hep C, HTN   Clinical Impression   Pt with decline in function and safety with LB ADLs and ADL mobility. Pt will benefit from acute OT services to address impairments to increase level of function and safety to return home with family assist prn    Follow Up Recommendations  No OT follow up;Supervision - Intermittent    Equipment Recommendations  Other (comment) (A/E kit)    Recommendations for Other Services       Precautions / Restrictions Precautions Precautions: None Restrictions Weight Bearing Restrictions: Yes LLE Weight Bearing: Weight bearing as tolerated Other Position/Activity Restrictions: L THA, direct anterior approach      Mobility Bed Mobility Overal bed mobility: Needs Assistance Bed Mobility: Supine to Sit     Supine to sit: Supervision     General bed mobility comments: pt up in recliner upon arrival  Transfers Overall transfer level: Needs assistance Equipment used: Rolling walker (2 wheeled) Transfers: Sit to/from Stand Sit to Stand: Supervision         General transfer comment: pt stands safely to RW    Balance Overall balance assessment: No apparent balance deficits (not formally assessed)                                          ADL Overall ADL's : Needs assistance/impaired     Grooming: Wash/dry hands;Wash/dry face;Standing;Supervision/safety;Set up   Upper Body Bathing: Set up;Sitting   Lower Body Bathing: Minimal assistance Lower Body Bathing Details (indicate cue type and reason): pt unable to reach feet Upper Body Dressing : Set up;Sitting   Lower Body Dressing: Minimal assistance Lower Body Dressing Details (indicate cue type and reason): pt  unable to reach feet to initiate Toilet Transfer: Supervision/safety;RW;Ambulation   Toileting- Clothing Manipulation and Hygiene: Supervision/safety;Sit to/from stand   Tub/ Banker: Supervision/safety;3 in 1   Functional mobility during ADLs: Supervision/safety General ADL Comments: Pt provided with education and demo of ADL A/E for home use     Vision  wears glasses, no change from baseline              Pertinent Vitals/Pain Pain Assessment: 0-10 Pain Score: 4  Pain Location: L hip Pain Descriptors / Indicators: Aching Pain Intervention(s): Monitored during session;Premedicated before session;Repositioned     Hand Dominance Right   Extremity/Trunk Assessment Upper Extremity Assessment Upper Extremity Assessment: Overall WFL for tasks assessed   Lower Extremity Assessment Lower Extremity Assessment: Defer to PT evaluation RLE Deficits / Details: bilateral hips with increased IR, pt able to modify with concentration, RLE WFL strength since THA in March LLE Deficits / Details: hip flex 2/5, increased IR as right hip LLE: Unable to fully assess due to pain   Cervical / Trunk Assessment Cervical / Trunk Assessment: Normal   Communication Communication Communication: No difficulties   Cognition Arousal/Alertness: Awake/alert Behavior During Therapy: WFL for tasks assessed/performed Overall Cognitive Status: Within Functional Limits for tasks assessed                     General Comments   pt very pleasant and cooperative  Home Living Family/patient expects to be discharged to:: Private residence Living Arrangements: Children;Other relatives Available Help at Discharge: Family Type of Home: House Home Access: Stairs to enter [employer] of Steps: 4 Entrance Stairs-Rails: Can reach both Home Layout: One level     Bathroom Shower/Tub: Tub/shower unit;Walk-in shower   Bathroom Toilet: Standard Bathroom  Accessibility: Yes How Accessible: Accessible via wheelchair Home Equipment: [place] - single point;Wheelchair - manual;Shower seat - built in;Bedside commode;Walker - 2 wheels          Prior Functioning/Environment Level of Independence: Independent with assistive device(s)        Comments: uses cane and RW    OT Diagnosis: Acute pain   OT Problem List: Decreased knowledge of use of DME or AE;Impaired balance (sitting and/or standing);Pain;Decreased activity tolerance   OT Treatment/Interventions: Self-care/ADL training;Patient/family education;Therapeutic activities;DME and/or AE instruction    OT Goals(Current goals can be found in the care plan section) Acute Rehab OT Goals Patient Stated Goal: return home, dance with his granddaughter OT Goal Formulation: With patient Time For Goal Achievement: 04/08/16 Potential to Achieve Goals: Good ADL Goals Pt Will Perform Lower Body Bathing: with min guard assist;with supervision;with set-up;with adaptive equipment Pt Will Perform Lower Body Dressing: with min guard assist;with supervision;with set-up;with adaptive equipment Pt Will Transfer to Toilet: with modified independence Pt Will Perform Toileting - Clothing Manipulation and hygiene: with modified independence;sit to/from stand Pt Will Perform Tub/Shower Transfer: with modified independence;3 in 1;shower seat  OT Frequency: Min 2X/week   Barriers to D/C:    none                     End of Session Equipment Utilized During Treatment: Rolling walker;Other (comment) (3 in 1, A/E demo)  Activity Tolerance: Patient tolerated treatment well Patient left: in chair;with call bell/phone within reach   Time: 9728-2060 OT Time Calculation (min): 24 min Charges:  OT General Charges $OT Visit: 1 Procedure OT Evaluation $OT Eval Moderate Complexity: 1 Procedure OT Treatments $Therapeutic Activity: 8-22 mins G-Codes:    Scott Cordova 04/01/2016, 11:21 AM

## 2016-04-01 NOTE — Evaluation (Signed)
Physical Therapy Evaluation Patient Details Name: Scott Cordova MRN: [id number] DOB: 04/13/1956 Today's Date: 04/01/2016   History of Present Illness  Pt is 60 yo male who presents for left direct anterior THA, had right done 3/17, PMH; arthritis, COPD, hep C, HTN  Clinical Impression  Pt is s/p THA resulting in the deficits listed below (see PT Problem List). Pt ambulated 100' with RW and supervision.  Pt will benefit from skilled PT to increase their independence and safety with mobility to allow discharge to the venue listed below.      Follow Up Recommendations Home health PT    Equipment Recommendations  None recommended by PT    Recommendations for Other Services       Precautions / Restrictions Precautions Precautions: None Restrictions Weight Bearing Restrictions: Yes LLE Weight Bearing: Weight bearing as tolerated Other Position/Activity Restrictions: L THA, direct anterior approach      Mobility  Bed Mobility Overal bed mobility: Needs Assistance Bed Mobility: Supine to Sit     Supine to sit: Supervision     General bed mobility comments: pt able to sit straight up and swing both legs over EOB holding them together  Transfers Overall transfer level: Needs assistance Equipment used: Rolling walker (2 wheeled) Transfers: Sit to/from Stand Sit to Stand: Supervision         General transfer comment: pt stands safely to RW  Ambulation/Gait Ambulation/Gait assistance: Supervision Ambulation Distance (Feet): 100 Feet Assistive device: Rolling walker (2 wheeled) Gait Pattern/deviations: Step-through pattern;Decreased weight shift to left Gait velocity: decreased Gait velocity interpretation: Below normal speed for age/gender General Gait Details: bilateral IR hips, able to correct with vc's  Stairs            Wheelchair Mobility    Modified Rankin (Stroke Patients Only)       Balance Overall balance assessment: No apparent balance deficits  (not formally assessed)                                           Pertinent Vitals/Pain Pain Assessment: 0-10 Pain Score: 3  Pain Location: L hip Pain Descriptors / Indicators: Aching Pain Intervention(s): Limited activity within patient's tolerance;Monitored during session;Premedicated before session;Repositioned    Home Living Family/patient expects to be discharged to:: Private residence Living Arrangements: Children;Other relatives Available Help at Discharge: Family Type of Home: House Home Access: Stairs to enter Entrance Stairs-Rails: Can reach both Entrance Stairs-Number of Steps: 4 Home Layout: One level Home Equipment: Cane - single point;Wheelchair - manual;Shower seat - built in;Bedside commode;Walker - 2 wheels      Prior Function Level of Independence: Independent with assistive device(s)         Comments: uses cane and RW     Hand Dominance   Dominant Hand: Right    Extremity/Trunk Assessment   Upper Extremity Assessment: Defer to OT evaluation           Lower Extremity Assessment: RLE deficits/detail;LLE deficits/detail RLE Deficits / Details: bilateral hips with increased IR, pt able to modify with concentration, RLE WFL strength since THA in March LLE Deficits / Details: hip flex 2/5, increased IR as right hip  Cervical / Trunk Assessment: Normal  Communication   Communication: No difficulties  Cognition Arousal/Alertness: Awake/alert Behavior During Therapy: WFL for tasks assessed/performed Overall Cognitive Status: Within Functional Limits for tasks assessed  General Comments      Exercises Total Joint Exercises Ankle Circles/Pumps: AROM;Both;20 reps;Seated Quad Sets: AROM;Both;20 reps;Seated Gluteal Sets: AROM;10 reps;Seated      Assessment/Plan    PT Assessment Patient needs continued PT services  PT Diagnosis Difficulty walking;Acute pain;Abnormality of gait   PT Problem List  Decreased strength;Decreased range of motion;Decreased mobility;Decreased knowledge of use of DME;Pain  PT Treatment Interventions DME instruction;Gait training;Stair training;Functional mobility training;Therapeutic activities;Therapeutic exercise;Patient/family education   PT Goals (Current goals can be found in the Care Plan section) Acute Rehab PT Goals Patient Stated Goal: return home, dance with his granddaughter PT Goal Formulation: With patient Time For Goal Achievement: 04/08/16 Potential to Achieve Goals: Good    Frequency 7X/week   Barriers to discharge        Co-evaluation               End of Session Equipment Utilized During Treatment: Gait belt Activity Tolerance: Patient tolerated treatment well Patient left: in chair;with call bell/phone within reach Nurse Communication: Mobility status         Time: [id number] PT Time Calculation (min) (ACUTE ONLY): 34 min   Charges:   PT Evaluation $PT Eval Low Complexity: 1 Procedure PT Treatments $Gait Training: 8-22 mins   PT G Codes:       Lyanne CoVictoria Timtohy Broski, PT  Acute Rehab Services  [phone]  Lyanne CoManess, Lokelani Lutes 04/01/2016, 10:17 AM

## 2016-04-01 NOTE — Care Management Note (Signed)
Case Management Note  Patient Details  Name: Scott Cordova MRN: [id number] Date of Birth: February 03, 1956  Subjective/Objective:  60 yr old gentleman s/p left total hip arthroplasty.               Action/Plan: Case manager spoke with patient and his daughter concerning home health and DME needs. Choice was offered for home health agency, [facility]. Patient already has rolling walker and 3in1 . Patient lives with his daughter and will have family support at discharge.     Expected Discharge Date:   04/01/16               Expected Discharge Plan:  Home w Home Health Services  In-House Referral:     Discharge planning Services  CM Consult  Post Acute Care Choice:  Home Health Choice offered to:  Patient  DME Arranged:  N/A DME Agency:  NA  HH Arranged:  PT HH Agency:  [facility]  Status of Service:  Completed, signed off  If discussed at Long Length of Stay Meetings, dates discussed:    Additional Comments:  Durenda GuthrieBrady, Gertude Benito Naomi, RN 04/01/2016, 1:40 PM

## 2016-04-01 NOTE — Progress Notes (Signed)
   04/01/16 1000  Clinical Encounter Type  Visited With Patient  Visit Type Spiritual support  Referral From Patient  Spiritual Encounters  Spiritual Needs Literature  Completed AD. Puneet Selden

## 2016-04-01 NOTE — Discharge Summary (Signed)
Discharge Summary  Patient ID: Jefferson Fullam MRN: [id number] DOB/AGE: 62-Nov-1957 62 y.o.  Admit date: 03/31/2016 Discharge date: 04/01/2016  Admission Diagnoses:  Primary osteoarthritis of left hip  Discharge Diagnoses:  Principal Problem:   Primary osteoarthritis of left hip Active Problems:   COPD (chronic obstructive pulmonary disease) (HCC)   Essential hypertension, benign   GERD (gastroesophageal reflux disease)   Hepatitis C   Osteoarthritis, hip, bilateral   HLD (hyperlipidemia)   AVN (avascular necrosis of bone) (HCC)   Past Medical History  Diagnosis Date  . Asthma   . COPD (chronic obstructive pulmonary disease) (HCC)   . Collar bone fracture   . Broken ribs   . Hypertension   . Shortness of breath dyspnea   . High cholesterol     "taking Lipitor for precaution" (03/31/2016)  . Sleep apnea 2014    [facility]- told that he didn't need it   . Hepatitis C     "took the cure" (03/31/2016)  . Arthritis     "all over my body"  . Osteoarthritis     OA - both hips, hands  . Anxiety     Surgeries: Procedure(s): TOTAL LEFT HIP ARTHROPLASTY ANTERIOR APPROACH on 03/31/2016   Consultants (if any):    Discharged Condition: Improved  Hospital Course: Toma Arts is an 60 y.o. male who was admitted 03/31/2016 with a diagnosis of Primary osteoarthritis of left hip and went to the operating room on 03/31/2016 and underwent the above named procedures.    He was given perioperative antibiotics:  Anti-infectives    Start     Dose/Rate Route Frequency Ordered Stop   03/31/16 2230  vancomycin (VANCOCIN) IVPB 1000 mg/200 mL premix     1,000 mg 200 mL/hr over 60 Minutes Intravenous Every 12 hours 03/31/16 1756 03/31/16 2347   03/31/16 1000  vancomycin (VANCOCIN) IVPB 1000 mg/200 mL premix     1,000 mg 200 mL/hr over 60 Minutes Intravenous To [facility] 03/30/16 1308 03/31/16 1156    .  He was given sequential compression devices,  early ambulation, and Asprin for DVT prophylaxis.  He benefited maximally from the hospital stay and there were no complications.    Recent vital signs:  Filed Vitals:   03/31/16 2046 04/01/16 0044  BP: 128/77 127/78  Pulse: 73 83  Temp: 98 F (36.7 C) 98.2 F (36.8 C)  Resp: 16 17    Recent laboratory studies:  Lab Results  Component Value Date   HGB 13.1 04/01/2016   HGB 15.9 03/17/2016   HGB 17.0 12/12/2015   Lab Results  Component Value Date   WBC 7.7 04/01/2016   PLT 148* 04/01/2016   Lab Results  Component Value Date   INR 1.19 03/17/2016   Lab Results  Component Value Date   NA 138 03/17/2016   K 3.7 03/17/2016   CL 103 03/17/2016   CO2 26 03/17/2016   BUN 9 03/17/2016   CREATININE 0.88 03/17/2016   GLUCOSE 84 03/17/2016    Discharge Medications:     Medication List    TAKE these medications        amLODipine 5 MG tablet  Commonly known as:  NORVASC  Take 1 tablet (5 mg total) by mouth daily.     aspirin EC 325 MG tablet  Take 1 tablet (325 mg total) by mouth daily.     atorvastatin 40 MG tablet  Commonly known as:  LIPITOR  Take 1 tablet (40  mg total) by mouth daily.     CENTRUM SILVER ADULT 50+ PO  Take 1 tablet by mouth every other day.     COMBIVENT RESPIMAT 20-100 MCG/ACT Aers respimat  Generic drug:  Ipratropium-Albuterol  INHALE ONE PUFF BY MOUTH INTO THE LUNGS EVERY 6 HOURS AS NEEDED FOR WHEEZING     fluticasone-salmeterol 230-21 MCG/ACT inhaler  Commonly known as:  ADVAIR HFA  Inhale 2 puffs into the lungs 2 (two) times daily.     INCRUSE ELLIPTA 62.5 MCG/INH Aepb  Generic drug:  umeclidinium bromide  Inhale 1 puff into the lungs daily.     methocarbamol 500 MG tablet  Commonly known as:  ROBAXIN  Take 500 mg by mouth every 6 (six) hours as needed for muscle spasms.     methocarbamol 500 MG tablet  Commonly known as:  ROBAXIN  Take 1 tablet (500 mg total) by mouth every 6 (six) hours as needed for muscle spasms.      ondansetron 4 MG tablet  Commonly known as:  ZOFRAN  Take 1 tablet (4 mg total) by mouth every 8 (eight) hours as needed for nausea or vomiting.     ondansetron 4 MG tablet  Commonly known as:  ZOFRAN  Take 1 tablet (4 mg total) by mouth every 8 (eight) hours as needed for nausea or vomiting.     oxyCODONE-acetaminophen 5-325 MG tablet  Commonly known as:  ROXICET  Take 1-2 tablets by mouth every 4 (four) hours as needed for severe pain.     VITAMIN B 12 PO  Take 2,500 mcg by mouth daily.     Vitamin D (Cholecalciferol) 1000 units Tabs  Take 2,000 Units by mouth every morning.     vitamin E 100 UNIT capsule  Take 400 Units by mouth daily.        Diagnostic Studies: Dg Hip Operative Unilat With Pelvis Left  03/31/2016  CLINICAL DATA:  60 year old male post left hip replacement. Initial encounter. EXAM: OPERATIVE left HIP (WITH PELVIS IF PERFORMED) 2 VIEWS TECHNIQUE: Fluoroscopic spot image(s) were submitted for interpretation post-operatively. COMPARISON:  None. FINDINGS: Post recent total left hip replacement which on this single frontal projection appears in satisfactory position without complication noted. Remote total right hip replacement. IMPRESSION: Post total left hip replacement without complication noted as noted above. Electronically Signed   By: Lacy DuverneySteven  Olson M.D.   On: 03/31/2016 12:39    Disposition: 06-Home-Health Care Svc        Follow-up Information    Follow up with MURPHY, TIMOTHY D, MD In 2 weeks.   Specialty:  Orthopedic Surgery   Contact information:   [address] [place] [id number] [phone]        Signed: Albina BilletHenry Calvin Martensen III PA-C 04/01/2016, 7:00 AM

## 2016-04-09 ENCOUNTER — Other Ambulatory Visit: Payer: Self-pay | Admitting: Family Medicine

## 2016-04-09 NOTE — Telephone Encounter (Signed)
Needs refill on amlodipine.  [facility] [place]  [address]  [phone]

## 2016-05-06 ENCOUNTER — Other Ambulatory Visit: Payer: Self-pay | Admitting: Family Medicine

## 2016-05-07 ENCOUNTER — Telehealth: Payer: Self-pay | Admitting: *Deleted

## 2016-05-07 NOTE — Telephone Encounter (Signed)
Refilled via pharmacy note earlier today.

## 2016-05-07 NOTE — Telephone Encounter (Signed)
Patient calling requesting refill on incruse. [facility] [facility]

## 2016-06-08 ENCOUNTER — Other Ambulatory Visit: Payer: Self-pay | Admitting: Family Medicine

## 2016-06-08 MED ORDER — IPRATROPIUM-ALBUTEROL 20-100 MCG/ACT IN AERS: INHALATION_SPRAY | RESPIRATORY_TRACT | 3 refills | Status: DC

## 2016-06-08 MED ORDER — INCRUSE ELLIPTA 62.5 MCG/INH IN AEPB: 1.0000 | INHALATION_SPRAY | Freq: Every day | RESPIRATORY_TRACT | 3 refills | Status: AC

## 2016-06-08 NOTE — Telephone Encounter (Signed)
Refill request for Incruse and Combivent Respimat.

## 2016-08-06 ENCOUNTER — Telehealth: Payer: Self-pay | Admitting: Family Medicine

## 2016-08-06 NOTE — Telephone Encounter (Signed)
Would like to talk to dr fletke. He has moved and is still under [employer]. He needs to switch drs because of insurance changing. Would like to thank dr Randolm Idolfletke for being his dr

## 2016-08-10 NOTE — Telephone Encounter (Signed)
Patient recently moved back to [place]. Has medicare. [employer] supplement not in network in [place]. Has not been able to complete rehab after recent surgery. Patient would like to know if he can still get chronic meds until the end of the year. Plans to undergo open enrollment in near future.   I informed him that I would be able to fill prescriptions until her establishes care in [place].

## 2016-08-14 ENCOUNTER — Telehealth: Payer: Self-pay | Admitting: Family Medicine

## 2016-08-14 NOTE — Telephone Encounter (Signed)
Needs either cilias or viagra-which ever one is cheaper. [facility] is [facility]

## 2016-08-14 NOTE — Telephone Encounter (Signed)
Called to inform pt but he stated he already spoke to dr fletke about refills. Audriella Blakeley Bruna PotterBlount, CMA

## 2016-08-14 NOTE — Telephone Encounter (Signed)
Nursing staff - please inform the patient that since I have never prescribed this for him I can not prescribe this without an appointment. I am aware that he now lives in [place]. He will need to address with his new PCP.

## 2016-09-07 ENCOUNTER — Other Ambulatory Visit: Payer: Self-pay | Admitting: Family Medicine

## 2016-09-07 MED ORDER — IPRATROPIUM-ALBUTEROL 20-100 MCG/ACT IN AERS: INHALATION_SPRAY | RESPIRATORY_TRACT | 3 refills | Status: AC

## 2016-09-07 NOTE — Telephone Encounter (Signed)
Pt needs a refill on combivent. Please [place] to [facility] [place], [place], address [address] [phone]. Please advise. Thanks! ep

## 2016-11-06 IMAGING — RF DG HIP (WITH PELVIS) OPERATIVE*L*
1 series · 2 of 2 positions shown · non-contrast
Comparison: None.

CLINICAL DATA: 60-year-old male post left hip replacement. Initial
encounter.

EXAM:
OPERATIVE left HIP (WITH PELVIS IF PERFORMED) 2 VIEWS
TECHNIQUE: Fluoroscopic spot image(s) were submitted for interpretation
post-operatively.

[Series 1: run · 2 of 2 slices shown]
[im 1/2]
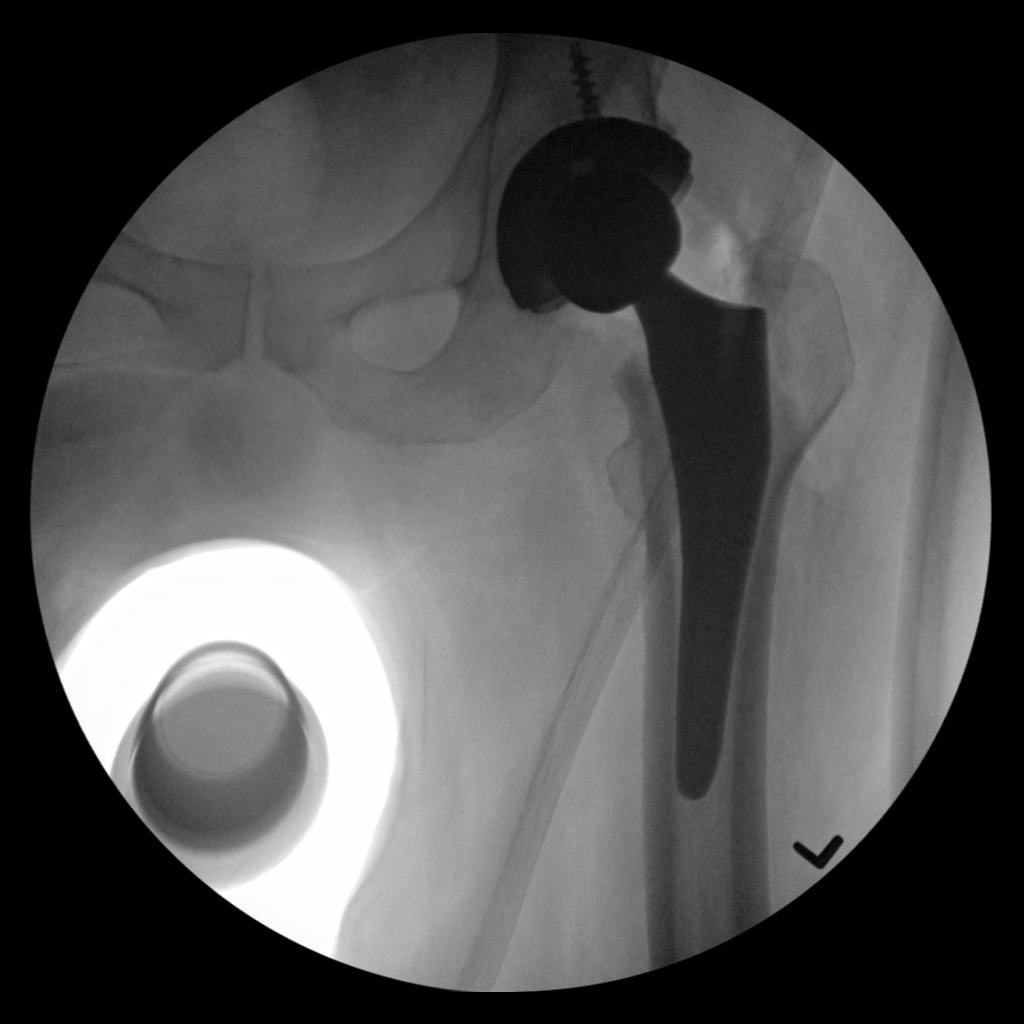
[im 2/2]
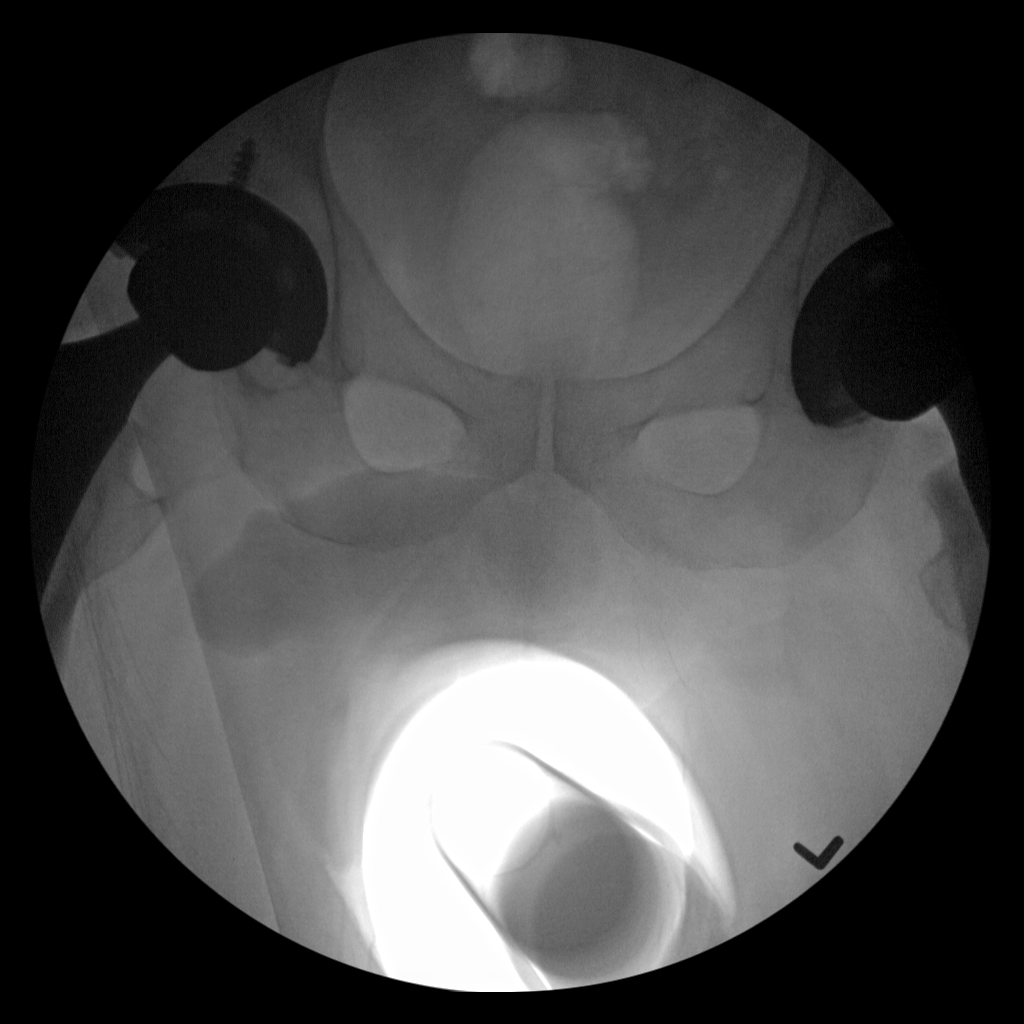

[2 of 2 positions shown; findings below may reference images not displayed]

FINDINGS: Post recent total left hip replacement which on this single frontal
projection appears in satisfactory position without complication
noted.

Remote total right hip replacement.
IMPRESSION: Post total left hip replacement without complication noted as noted
above.

## 2016-11-11 ENCOUNTER — Other Ambulatory Visit: Payer: Self-pay | Admitting: Family Medicine

## 2016-11-11 NOTE — Telephone Encounter (Signed)
Please call patient and inform him that I am not able to refill this medication as he currently lives in [place]. He needs to establish care with another provider.

## 2016-11-11 NOTE — Telephone Encounter (Signed)
Pt needs a refill on incruse [facility] [place] [facility] [place] [phone].ep

## 2016-11-16 NOTE — Telephone Encounter (Signed)
Spoke with patient and explained that medication can not be refilled and why. He expressed understanding. Maryjean Mornempestt S Kileen Lange, CMA
